# Patient Record
Sex: Female | Born: 1955 | Race: Asian | Hispanic: No | Smoking: Never smoker
Health system: Southern US, Community
[De-identification: ages and names within clinical notes are randomized; demographics above are authoritative.]

## PROBLEM LIST (undated history)

## (undated) DIAGNOSIS — I10 Essential (primary) hypertension: Secondary | ICD-10-CM

---

## 2018-12-10 ENCOUNTER — Other Ambulatory Visit: Payer: Self-pay

## 2018-12-10 ENCOUNTER — Encounter (HOSPITAL_COMMUNITY): Payer: Self-pay

## 2018-12-10 ENCOUNTER — Ambulatory Visit (HOSPITAL_COMMUNITY)
Admission: EM | Admit: 2018-12-10 | Discharge: 2018-12-10 | Disposition: A | Discharge status: Home / Self Care | Payer: Self-pay | Attending: Family Medicine | Admitting: Family Medicine

## 2018-12-10 DIAGNOSIS — I10 Essential (primary) hypertension: Secondary | ICD-10-CM

## 2018-12-10 DIAGNOSIS — Z760 Encounter for issue of repeat prescription: Secondary | ICD-10-CM

## 2018-12-10 HISTORY — DX: Essential (primary) hypertension: I10

## 2018-12-10 MED ORDER — AMLODIPINE BESYLATE 10 MG PO TABS: 10.0000 mg | ORAL_TABLET | Freq: Every day | ORAL | 0 refills | Status: DC

## 2018-12-10 MED ORDER — CARVEDILOL 12.5 MG PO TABS: 12.5000 mg | ORAL_TABLET | Freq: Two times a day (BID) | ORAL | 0 refills | Status: DC

## 2018-12-10 NOTE — ED Triage Notes (Signed)
Patient presents to Urgent Care with complaints of needing a medication refill on her amlodipine and carvedilol since she does not have a PCP.

## 2018-12-10 NOTE — Discharge Instructions (Signed)
Your amlodipine and carvedilol refilled.  Please continue to take as prescribed.  Please establish care with primary care-to contact below  Please go to Emergency Room if you start to experience severe headache, vision changes, decreased urine production, chest pain, shortness of breath, speech slurring, one sided weakness.

## 2018-12-10 NOTE — ED Provider Notes (Signed)
[facility]    CSN: [id number] Arrival date & time: 12/10/18  1128      History   Chief Complaint Chief Complaint  Patient presents with  . Medication Refill    HPI Becky Gross is a 63 y.o. female history of hypertension presenting today for medication refill.  Patient states that she is set to run out of her amlodipine and carvedilol on 6/30.  States that the medicine was initially prescribed to her when she was [place] the [place] from her PCP.  Patient now lives here, but has not set up with PCP as she does not have insurance.  She denies other issues with her heart besides hypertension.  Denies any issues with the medicines.  Denies any symptoms of headache, vision changes, chest pain, shortness of breath.  Denies any lower extremity swelling.  Patient states that she has lost a lot of weight.  She walks on the treadmill for 2 hours a day.  HPI  Past Medical History:  Diagnosis Date  . Hypertension     There are no active problems to display for this patient.   History reviewed. No pertinent surgical history.  OB History   No obstetric history on file.      Home Medications    Prior to Admission medications   Medication Sig Start Date End Date Taking? Authorizing Provider  amLODipine (NORVASC) 10 MG tablet Take 1 tablet (10 mg total) by mouth daily. 12/10/18   Kristelle Cavallaro C, PA-C  carvedilol (COREG) 12.5 MG tablet Take 1 tablet (12.5 mg total) by mouth 2 (two) times daily with a meal. 12/10/18   Kaige Whistler, Junius CreamerHallie C, PA-C    Family History Family History  Family history unknown: Yes    Social History Social History   Tobacco Use  . Smoking status: Never Smoker  . Smokeless tobacco: Never Used  Substance Use Topics  . Alcohol use: Not Currently  . Drug use: Not Currently     Allergies   Patient has no known allergies.   Review of Systems Review of Systems  Constitutional: Negative for fatigue and fever.  HENT: Negative for  congestion, sinus pressure and sore throat.   Eyes: Negative for photophobia, pain and visual disturbance.  Respiratory: Negative for cough and shortness of breath.   Cardiovascular: Negative for chest pain and leg swelling.  Gastrointestinal: Negative for abdominal pain, nausea and vomiting.  Genitourinary: Negative for decreased urine volume and hematuria.  Musculoskeletal: Negative for myalgias, neck pain and neck stiffness.  Neurological: Negative for dizziness, syncope, facial asymmetry, speech difficulty, weakness, light-headedness, numbness and headaches.     Physical Exam Triage Vital Signs ED Triage Vitals  Enc Vitals Group     BP 12/10/18 1154 138/78     Pulse Rate 12/10/18 1154 74     Resp 12/10/18 1154 17     Temp 12/10/18 1154 98.4 F (36.9 C)     Temp Source 12/10/18 1154 Oral     SpO2 12/10/18 1154 100 %     Weight --      Height --      Head Circumference --      Peak Flow --      Pain Score 12/10/18 1151 0     Pain Loc --      Pain Edu? --      Excl. in GC? --    No data found.  Updated Vital Signs BP 138/78 (BP Location: Right Arm)   Pulse 74  Temp 98.4 F (36.9 C) (Oral)   Resp 17   SpO2 100%   Visual Acuity Right Eye Distance:   Left Eye Distance:   Bilateral Distance:    Right Eye Near:   Left Eye Near:    Bilateral Near:     Physical Exam Vitals signs and nursing note reviewed.  Constitutional:      General: She is not in acute distress.    Appearance: She is well-developed.  HENT:     Head: Normocephalic and atraumatic.  Eyes:     Extraocular Movements: Extraocular movements intact.     Conjunctiva/sclera: Conjunctivae normal.     Pupils: Pupils are equal, round, and reactive to light.  Neck:     Musculoskeletal: Neck supple.  Cardiovascular:     Rate and Rhythm: Normal rate and regular rhythm.     Heart sounds: No murmur.  Pulmonary:     Effort: Pulmonary effort is normal. No respiratory distress.     Breath sounds: Normal  breath sounds.     Comments: Breathing comfortably at rest, CTABL, no wheezing, rales or other adventitious sounds auscultated Abdominal:     Palpations: Abdomen is soft.     Tenderness: There is no abdominal tenderness.  Skin:    General: Skin is warm and dry.  Neurological:     Mental Status: She is alert.      UC Treatments / Results  Labs (all labs ordered are listed, but only abnormal results are displayed) Labs Reviewed - No data to display  EKG None  Radiology No results found.  Procedures Procedures (including critical care time)  Medications Ordered in UC Medications - No data to display  Initial Impression / Assessment and Plan / UC Course  I have reviewed the triage vital signs and the nursing notes.  Pertinent labs & imaging results that were available during my care of the patient were reviewed by me and considered in my medical decision making (see chart for details).     Vital signs stable today in clinic.  Will refill medicines to continue to take.  Provided to contact for primary care that accepts patients without insurance.  Advised to follow-up with them for further refills and management of her blood pressure.Discussed strict return precautions. Patient verbalized understanding and is agreeable with plan.  Final Clinical Impressions(s) / UC Diagnoses   Final diagnoses:  Medication refill  Essential hypertension     Discharge Instructions     Your amlodipine and carvedilol refilled.  Please continue to take as prescribed.  Please establish care with primary care-to contact below  Please go to Emergency Room if you start to experience severe headache, vision changes, decreased urine production, chest pain, shortness of breath, speech slurring, one sided weakness.   ED Prescriptions    Medication Sig Dispense Auth. Provider   amLODipine (NORVASC) 10 MG tablet Take 1 tablet (10 mg total) by mouth daily. 60 tablet Anthany Thornhill C, PA-C    carvedilol (COREG) 12.5 MG tablet Take 1 tablet (12.5 mg total) by mouth 2 (two) times daily with a meal. 120 tablet Haizlee Henton C, PA-C     Controlled Substance Prescriptions [place] Controlled Substance Registry consulted? Not Applicable   Janith Lima, [place] 12/10/18 1216

## 2018-12-17 ENCOUNTER — Ambulatory Visit: Payer: Self-pay | Admitting: Family Medicine

## 2019-03-21 ENCOUNTER — Other Ambulatory Visit: Payer: Self-pay

## 2019-03-21 ENCOUNTER — Encounter (HOSPITAL_COMMUNITY): Payer: Self-pay

## 2019-03-21 ENCOUNTER — Telehealth (HOSPITAL_COMMUNITY): Payer: Self-pay | Admitting: Emergency Medicine

## 2019-03-21 ENCOUNTER — Ambulatory Visit (HOSPITAL_COMMUNITY)
Admission: EM | Admit: 2019-03-21 | Discharge: 2019-03-21 | Disposition: A | Discharge status: Home / Self Care | Payer: Self-pay | Attending: Family Medicine | Admitting: Family Medicine

## 2019-03-21 DIAGNOSIS — Z760 Encounter for issue of repeat prescription: Secondary | ICD-10-CM | POA: Insufficient documentation

## 2019-03-21 DIAGNOSIS — I10 Essential (primary) hypertension: Secondary | ICD-10-CM | POA: Insufficient documentation

## 2019-03-21 DIAGNOSIS — Y93B3 Activity, free weights: Secondary | ICD-10-CM

## 2019-03-21 DIAGNOSIS — R238 Other skin changes: Secondary | ICD-10-CM | POA: Insufficient documentation

## 2019-03-21 DIAGNOSIS — S39011A Strain of muscle, fascia and tendon of abdomen, initial encounter: Secondary | ICD-10-CM | POA: Insufficient documentation

## 2019-03-21 LAB — CBC
HCT: 42.7 % (ref 36.0–46.0)
Hemoglobin: 14.3 g/dL (ref 12.0–15.0)
MCH: 30.6 pg (ref 26.0–34.0)
MCHC: 33.5 g/dL (ref 30.0–36.0)
MCV: 91.4 fL (ref 80.0–100.0)
Platelets: 312 10*3/uL (ref 150–400)
RBC: 4.67 MIL/uL (ref 3.87–5.11)
RDW: 11.9 % (ref 11.5–15.5)
WBC: 6.1 10*3/uL (ref 4.0–10.5)
nRBC: 0 % (ref 0.0–0.2)

## 2019-03-21 LAB — BASIC METABOLIC PANEL
Anion gap: 9 (ref 5–15)
BUN: 10 mg/dL (ref 8–23)
CO2: 30 mmol/L (ref 22–32)
Calcium: 9.5 mg/dL (ref 8.9–10.3)
Chloride: 103 mmol/L (ref 98–111)
Creatinine, Ser: 0.64 mg/dL (ref 0.44–1.00)
GFR calc Af Amer: >60 mL/min (ref >60)
GFR calc non Af Amer: >60 mL/min (ref >60)
Glucose, Bld: 130 mg/dL — ABNORMAL HIGH (ref 70–99)
Potassium: 3.7 mmol/L (ref 3.5–5.1)
Sodium: 142 mmol/L (ref 135–145)

## 2019-03-21 MED ORDER — SELENIUM SULFIDE 2.5 % EX LOTN: 1.0000 "application " | TOPICAL_LOTION | Freq: Every day | CUTANEOUS | 2 refills | Status: DC | PRN

## 2019-03-21 MED ORDER — ACETAMINOPHEN 500 MG PO TABS: 1000.0000 mg | ORAL_TABLET | Freq: Three times a day (TID) | ORAL | 0 refills | Status: DC | PRN

## 2019-03-21 MED ORDER — OMEPRAZOLE 20 MG PO CPDR: 20.0000 mg | DELAYED_RELEASE_CAPSULE | Freq: Every day | ORAL | 0 refills | Status: DC

## 2019-03-21 MED ORDER — AMLODIPINE BESYLATE 10 MG PO TABS: 10.0000 mg | ORAL_TABLET | Freq: Every day | ORAL | 0 refills | Status: DC

## 2019-03-21 MED ORDER — CARVEDILOL 12.5 MG PO TABS: 12.5000 mg | ORAL_TABLET | Freq: Two times a day (BID) | ORAL | 0 refills | Status: DC

## 2019-03-21 NOTE — ED Triage Notes (Signed)
Pt presents with epigastric abdominal pain with lifting & movement X 1 week. Pt also complains of scalp issue; pt states she has used many different shampoos and they are not working.  Pt also presents for medication refill.

## 2019-03-21 NOTE — ED Provider Notes (Signed)
[place]    CSN: [id number] Arrival date & time: 03/21/19  0845      History   Chief Complaint Chief Complaint  Patient presents with  . Abdominal Pain  . Medication Refill    HPI Becky Gross is a 63 y.o. female.   Becky Gross presents with complaints of upper abdominal wall pain which started while exercising and lifting weights. 1 week ago. Now, engagement of the affected muscle such as with transitioning from sit to lay and lay to sit as well as twisting, or touching of the area causes pain. Hasn't taken any medications for her pain. Has applied hot compresses. This has not helped. States she has had this occur once before and she was prescribed a medication which took the pain away within a week. No nausea vomiting, shortness of breath , chest pain , fevers, cough. She is eating and drinking regularly. She states she is very active, walks and lifts weights. Also with a dry/flaky patch to posterior scalp, for months. Has tried multiple types of shampoos including head and shoulders, which have not helped. Also requesting refill of her BP medications as she is out. Doesn't follow with a PCP. Received a 60 day supply from this clinic 12/09/28.    ROS per HPI, negative if not otherwise mentioned.      Past Medical History:  Diagnosis Date  . Hypertension     There are no active problems to display for this patient.   History reviewed. No pertinent surgical history.  OB History   No obstetric history on file.      Home Medications    Prior to Admission medications   Medication Sig Start Date End Date Taking? Authorizing Provider  acetaminophen (TYLENOL) 500 MG tablet Take 2 tablets (1,000 mg total) by mouth every 8 (eight) hours as needed for moderate pain. 03/21/19   Zigmund Gottron, NP  amLODipine (NORVASC) 10 MG tablet Take 1 tablet (10 mg total) by mouth daily. 03/21/19   Zigmund Gottron, NP  carvedilol (COREG) 12.5 MG tablet Take 1 tablet  (12.5 mg total) by mouth 2 (two) times daily with a meal. 03/21/19   , Malachy Moan, NP  omeprazole (PRILOSEC) 20 MG capsule Take 1 capsule (20 mg total) by mouth daily. 03/21/19   Zigmund Gottron, NP  selenium sulfide (SELSUN) 2.5 % shampoo Apply 1 application topically daily as needed for irritation. 03/21/19   Zigmund Gottron, NP    Family History Family History  Family history unknown: Yes    Social History Social History   Tobacco Use  . Smoking status: Never Smoker  . Smokeless tobacco: Never Used  Substance Use Topics  . Alcohol use: Not Currently  . Drug use: Not Currently     Allergies   Patient has no known allergies.   Review of Systems Review of Systems   Physical Exam Triage Vital Signs ED Triage Vitals  Enc Vitals Group     BP 03/21/19 0915 (!) 157/73     Pulse Rate 03/21/19 0915 72     Resp 03/21/19 0915 17     Temp 03/21/19 0915 98.5 F (36.9 C)     Temp Source 03/21/19 0915 Oral     SpO2 03/21/19 0915 96 %     Weight --      Height --      Head Circumference --      Peak Flow --      Pain Score 03/21/19  1017 5     Pain Loc --      Pain Edu? --      Excl. in GC? --    No data found.  Updated Vital Signs BP (!) 157/73 (BP Location: Left Arm)   Pulse 72   Temp 98.5 F (36.9 C) (Oral)   Resp 17   SpO2 96%    Physical Exam Constitutional:      General: She is not in acute distress.    Appearance: She is well-developed.  HENT:     Head:      Comments: Dry scaly and flaky patch of skin to skin fold at occiput region of head; no redness, induration or fluctuance; no overlying alopecia; no defined lesion edges visible  Cardiovascular:     Rate and Rhythm: Normal rate and regular rhythm.     Heart sounds: Normal heart sounds.  Pulmonary:     Effort: Pulmonary effort is normal.     Breath sounds: Normal breath sounds.  Abdominal:     Tenderness: There is abdominal tenderness.       Comments: Very specific point tenderness to  epigastric region/ right breast/ upper abdominal wall on palpation; patient appears uncomfortable with engagement of abdominal muscles, demonstrated with transition from sit to lay and then return to sit from lay  Skin:    General: Skin is warm and dry.  Neurological:     Mental Status: She is alert and oriented to person, place, and time.      UC Treatments / Results  Labs (all labs ordered are listed, but only abnormal results are displayed) Labs Reviewed  CBC  BASIC METABOLIC PANEL    EKG   Radiology No results found.  Procedures Procedures (including critical care time)  Medications Ordered in UC Medications - No data to display  Initial Impression / Assessment and Plan / UC Course  I have reviewed the triage vital signs and the nursing notes.  Pertinent labs & imaging results that were available during my care of the patient were reviewed by me and considered in my medical decision making (see chart for details).     Abdominal wall strain from working out, encouraged rest as able until improves, tylenol as needed. No red flag findings with history and exam, point tenderness on palpation. Selsun 2.5% for flaky scalp. Unfortunately affected area is to a skin fold which may be more difficult for resolution. Basic labs obtained as no previous labs in system, meds refilled for 30days, discussed with patient that she needs to establish with a PCP for long term management. Return precautions provided. Patient verbalized understanding and agreeable to plan.  Ambulatory out of clinic without difficulty.    Final Clinical Impressions(s) / UC Diagnoses   Final diagnoses:  Strain of abdominal wall, initial encounter  Dry scalp  Medication refill  Hypertension, unspecified type     Discharge Instructions     May try higher concentration of selsun to help with your scalp.  I have refilled your BP medications for 1 month supply. You need to establish with a primary care  provider for long term management of your medications and for recheck.  Tylenol three times a day to help with your upper abdomin pain, limit lifting and strain to the area until symptoms improve. Daily omeprazole as well.  Any worsening of pain, weakness, shortness of breath , chest pain , nausea, please go to the ER.    ED Prescriptions    Medication Sig  Dispense Auth. Provider   amLODipine (NORVASC) 10 MG tablet Take 1 tablet (10 mg total) by mouth daily. 30 tablet Linus MakoBurky,  B, NP   carvedilol (COREG) 12.5 MG tablet Take 1 tablet (12.5 mg total) by mouth 2 (two) times daily with a meal. 60 tablet Linus MakoBurky,  B, NP   acetaminophen (TYLENOL) 500 MG tablet Take 2 tablets (1,000 mg total) by mouth every 8 (eight) hours as needed for moderate pain. 30 tablet Linus MakoBurky,  B, NP   omeprazole (PRILOSEC) 20 MG capsule Take 1 capsule (20 mg total) by mouth daily. 30 capsule Linus MakoBurky,  B, NP   selenium sulfide (SELSUN) 2.5 % shampoo Apply 1 application topically daily as needed for irritation. 118 mL Linus MakoBurky,  B, NP     PDMP not reviewed this encounter.   Georgetta HaberBurky,  B, NP 03/21/19 1059

## 2019-03-21 NOTE — Telephone Encounter (Signed)
Results reported to patient by phone, given PCP follow up info for recheck of blood work, a1c, blood pressure management.

## 2019-03-21 NOTE — Discharge Instructions (Signed)
May try higher concentration of selsun to help with your scalp.  I have refilled your BP medications for 1 month supply. You need to establish with a primary care provider for long term management of your medications and for recheck.  Tylenol three times a day to help with your upper abdomin pain, limit lifting and strain to the area until symptoms improve. Daily omeprazole as well.  Any worsening of pain, weakness, shortness of breath , chest pain , nausea, please go to the ER.

## 2019-05-19 ENCOUNTER — Ambulatory Visit (HOSPITAL_COMMUNITY)
Admission: EM | Admit: 2019-05-19 | Discharge: 2019-05-19 | Disposition: A | Discharge status: Home / Self Care | Payer: Self-pay | Attending: Family Medicine | Admitting: Family Medicine

## 2019-05-19 ENCOUNTER — Other Ambulatory Visit: Payer: Self-pay

## 2019-05-19 ENCOUNTER — Encounter (HOSPITAL_COMMUNITY): Payer: Self-pay

## 2019-05-19 DIAGNOSIS — I10 Essential (primary) hypertension: Secondary | ICD-10-CM

## 2019-05-19 DIAGNOSIS — L210 Seborrhea capitis: Secondary | ICD-10-CM

## 2019-05-19 MED ORDER — BETAMETHASONE VALERATE 0.1 % EX LOTN: 1.0000 "application " | TOPICAL_LOTION | Freq: Two times a day (BID) | CUTANEOUS | 2 refills | Status: DC

## 2019-05-19 MED ORDER — CARVEDILOL 12.5 MG PO TABS: 12.5000 mg | ORAL_TABLET | Freq: Two times a day (BID) | ORAL | 3 refills | Status: DC

## 2019-05-19 MED ORDER — AMLODIPINE BESYLATE 10 MG PO TABS: 10.0000 mg | ORAL_TABLET | Freq: Every day | ORAL | 3 refills | Status: DC

## 2019-05-19 NOTE — ED Triage Notes (Signed)
Patient presents to Urgent Care with complaints of needing a refill on shampoo she has been using for itching on her scalp. Patient reports she has been using a shampoo that has not had any improvment.

## 2019-05-19 NOTE — ED Provider Notes (Signed)
[place]    CSN: [id number] Arrival date & time: 05/19/19  2774      History   Chief Complaint Chief Complaint  Patient presents with  . Medication Refill    HPI Becky Gross is a 63 y.o. female.   This is a 63 year old established [facility] urgent care patient who comes in for refill on medicated shampoo.  She has been seen here before for hypertension.  Is currently on amlodipine and carvedilol.  Patient has had scaly scalp problem for over a year.  The Selsun does not work.  Has been applying neosporin lately and Johnsons oil.  Not getting better.  Itchy and scaly.     Past Medical History:  Diagnosis Date  . Hypertension     There are no active problems to display for this patient.   History reviewed. No pertinent surgical history.  OB History   No obstetric history on file.      Home Medications    Prior to Admission medications   Medication Sig Start Date End Date Taking? Authorizing Provider  acetaminophen (TYLENOL) 500 MG tablet Take 2 tablets (1,000 mg total) by mouth every 8 (eight) hours as needed for moderate pain. 03/21/19   Zigmund Gottron, NP  amLODipine (NORVASC) 10 MG tablet Take 1 tablet (10 mg total) by mouth daily. 05/19/19   Robyn Haber, MD  betamethasone valerate lotion (VALISONE) 0.1 % Apply 1 application topically 2 (two) times daily. 05/19/19   Robyn Haber, MD  carvedilol (COREG) 12.5 MG tablet Take 1 tablet (12.5 mg total) by mouth 2 (two) times daily with a meal. 05/19/19   Robyn Haber, MD  omeprazole (PRILOSEC) 20 MG capsule Take 1 capsule (20 mg total) by mouth daily. 03/21/19   Zigmund Gottron, NP  selenium sulfide (SELSUN) 2.5 % shampoo Apply 1 application topically daily as needed for irritation. 03/21/19   Zigmund Gottron, NP    Family History Family History  Family history unknown: Yes    Social History Social History   Tobacco Use  . Smoking status: Never Smoker  . Smokeless tobacco:  Never Used  Substance Use Topics  . Alcohol use: Not Currently  . Drug use: Not Currently     Allergies   Patient has no known allergies.   Review of Systems Review of Systems  Skin: Positive for rash.  All other systems reviewed and are negative.    Physical Exam Triage Vital Signs ED Triage Vitals  Enc Vitals Group     BP 05/19/19 0902 131/71     Pulse Rate 05/19/19 0902 67     Resp 05/19/19 0902 15     Temp 05/19/19 0902 98.2 F (36.8 C)     Temp Source 05/19/19 0902 Oral     SpO2 05/19/19 0902 100 %     Weight --      Height --      Head Circumference --      Peak Flow --      Pain Score 05/19/19 0900 0     Pain Loc --      Pain Edu? --      Excl. in [place]? --    No data found.  Updated Vital Signs BP 131/71 (BP Location: Left Arm)   Pulse 67   Temp 98.2 F (36.8 C) (Oral)   Resp 15   SpO2 100%    Physical Exam Vitals signs and nursing note reviewed.  Constitutional:  General: She is not in acute distress.    Appearance: Normal appearance. She is normal weight.  Eyes:     Conjunctiva/sclera: Conjunctivae normal.  Neck:     Musculoskeletal: Normal range of motion and neck supple.  Pulmonary:     Effort: Pulmonary effort is normal.  Musculoskeletal: Normal range of motion.  Skin:    General: Skin is warm.     Findings: Rash present.     Comments: Scaly occipital scalp  Neurological:     General: No focal deficit present.     Mental Status: She is alert and oriented to person, place, and time.  Psychiatric:        Mood and Affect: Mood normal.        Thought Content: Thought content normal.      UC Treatments / Results  Labs (all labs ordered are listed, but only abnormal results are displayed) Labs Reviewed - No data to display  EKG   Radiology No results found.  Procedures Procedures (including critical care time)  Medications Ordered in UC Medications - No data to display  Initial Impression / Assessment and Plan / UC  Course  I have reviewed the triage vital signs and the nursing notes.  Pertinent labs & imaging results that were available during my care of the patient were reviewed by me and considered in my medical decision making (see chart for details).    Final Clinical Impressions(s) / UC Diagnoses   Final diagnoses:  Seborrhea capitis in adult  Essential hypertension   Discharge Instructions   None    ED Prescriptions    Medication Sig Dispense Auth. Provider   amLODipine (NORVASC) 10 MG tablet Take 1 tablet (10 mg total) by mouth daily. 90 tablet Elvina Sidle, MD   carvedilol (COREG) 12.5 MG tablet Take 1 tablet (12.5 mg total) by mouth 2 (two) times daily with a meal. 180 tablet Elvina Sidle, MD   betamethasone valerate lotion (VALISONE) 0.1 % Apply 1 application topically 2 (two) times daily. 60 mL Elvina Sidle, MD     I have reviewed the PDMP during this encounter.   Elvina Sidle, MD 05/19/19 [phone]

## 2019-11-14 ENCOUNTER — Other Ambulatory Visit: Payer: Self-pay

## 2019-11-14 ENCOUNTER — Encounter (HOSPITAL_COMMUNITY): Payer: Self-pay

## 2019-11-14 ENCOUNTER — Ambulatory Visit (HOSPITAL_COMMUNITY)
Admission: EM | Admit: 2019-11-14 | Discharge: 2019-11-14 | Disposition: A | Discharge status: Home / Self Care | Payer: Self-pay | Attending: Family Medicine | Admitting: Family Medicine

## 2019-11-14 DIAGNOSIS — N939 Abnormal uterine and vaginal bleeding, unspecified: Secondary | ICD-10-CM

## 2019-11-14 DIAGNOSIS — S39013A Strain of muscle, fascia and tendon of pelvis, initial encounter: Secondary | ICD-10-CM

## 2019-11-14 NOTE — ED Triage Notes (Signed)
Patient stated she started having vaginal bleeding after having sexual intercourse with her fiance this morning. Noticed passing a small clot this morning. Bleeding very small amounts, has not had to wear a pad.

## 2019-11-14 NOTE — ED Provider Notes (Signed)
[facility]    CSN: [id number] Arrival date & time: 11/14/19  1629      History   Chief Complaint Chief Complaint  Patient presents with  . Vaginal Bleeding    HPI Becky Gross is a 64 y.o. female.   HPI  Patient is here for vaginal bleeding She states that she had a sexual encounter this morning after not having any intercourse for over 20 years She states that it was "very painful" Ever since then she has been bleeding She is here for evaluation Prior to her sexual encounter she felt well She states she had menopause at age 62  Past Medical History:  Diagnosis Date  . Hypertension     There are no problems to display for this patient.   History reviewed. No pertinent surgical history.  OB History   No obstetric history on file.      Home Medications    Prior to Admission medications   Medication Sig Start Date End Date Taking? Authorizing Provider  acetaminophen (TYLENOL) 500 MG tablet Take 2 tablets (1,000 mg total) by mouth every 8 (eight) hours as needed for moderate pain. 03/21/19   Georgetta Haber, NP  amLODipine (NORVASC) 10 MG tablet Take 1 tablet (10 mg total) by mouth daily. 05/19/19   Elvina Sidle, MD  betamethasone valerate lotion (VALISONE) 0.1 % Apply 1 application topically 2 (two) times daily. 05/19/19   Elvina Sidle, MD  carvedilol (COREG) 12.5 MG tablet Take 1 tablet (12.5 mg total) by mouth 2 (two) times daily with a meal. 05/19/19   Elvina Sidle, MD  omeprazole (PRILOSEC) 20 MG capsule Take 1 capsule (20 mg total) by mouth daily. 03/21/19   Georgetta Haber, NP  selenium sulfide (SELSUN) 2.5 % shampoo Apply 1 application topically daily as needed for irritation. 03/21/19   Georgetta Haber, NP    Family History Family History  Family history unknown: Yes    Social History Social History   Tobacco Use  . Smoking status: Never Smoker  . Smokeless tobacco: Never Used  Substance Use Topics  . Alcohol use:  Not Currently  . Drug use: Not Currently     Allergies   Patient has no known allergies.   Review of Systems Review of Systems  Genitourinary: Positive for vaginal bleeding.     Physical Exam Triage Vital Signs ED Triage Vitals  Enc Vitals Group     BP 11/14/19 1726 (!) 176/78     Pulse Rate 11/14/19 1726 63     Resp 11/14/19 1726 14     Temp 11/14/19 1726 99 F (37.2 C)     Temp Source 11/14/19 1726 Oral     SpO2 11/14/19 1726 97 %     Weight --      Height --      Head Circumference --      Peak Flow --      Pain Score 11/14/19 1724 4     Pain Loc --      Pain Edu? --      Excl. in GC? --    No data found.  Updated Vital Signs BP (!) 176/78 (BP Location: Left Arm)   Pulse 63   Temp 99 F (37.2 C) (Oral)   Resp 14   SpO2 97%       Physical Exam Constitutional:      General: She is not in acute distress.    Appearance: She is well-developed.  HENT:  Head: Normocephalic and atraumatic.  Eyes:     Conjunctiva/sclera: Conjunctivae normal.     Pupils: Pupils are equal, round, and reactive to light.  Cardiovascular:     Rate and Rhythm: Normal rate.  Pulmonary:     Effort: Pulmonary effort is normal. No respiratory distress.  Abdominal:     General: There is no distension.     Palpations: Abdomen is soft.     Tenderness: There is no abdominal tenderness.  Genitourinary:    Exam position: Lithotomy position.     Labia:        Right: No rash, tenderness or injury.        Left: No rash, tenderness or injury.      Vagina: Signs of injury present.        Comments: There was blood in the vault.  Small clots.  Cervix is parous.  No blood in the os.  Laceration is inferior to the cervix.  It is not through and through.  It is bleeding slightly.  Bimanual is negative, no uterine tenderness or abdominal tenderness Musculoskeletal:        General: Normal range of motion.     Cervical back: Normal range of motion.  Skin:    General: Skin is warm and  dry.  Neurological:     Mental Status: She is alert.      UC Treatments / Results  Labs (all labs ordered are listed, but only abnormal results are displayed) Labs Reviewed - No data to display  EKG   Radiology No results found.  Procedures Procedures (including critical care time)  Medications Ordered in UC Medications - No data to display  Initial Impression / Assessment and Plan / UC Course  I have reviewed the triage vital signs and the nursing notes.  Pertinent labs & imaging results that were available during my care of the patient were reviewed by me and considered in my medical decision making (see chart for details).      Final Clinical Impressions(s) / UC Diagnoses   Final diagnoses:  Tear of vaginal muscle, initial encounter  Vaginal bleeding     Discharge Instructions     This should heal without treatment Expect bleeding another day or so It should gradually go away See a gynecologist if it is not better by Monday Go to ER if it gets worse   ED Prescriptions    None     PDMP not reviewed this encounter.   Raylene Everts, MD 11/14/19 2158

## 2019-11-14 NOTE — Discharge Instructions (Signed)
This should heal without treatment Expect bleeding another day or so It should gradually go away See a gynecologist if it is not better by Monday Go to ER if it gets worse

## 2019-12-30 ENCOUNTER — Ambulatory Visit (HOSPITAL_COMMUNITY)
Admission: EM | Admit: 2019-12-30 | Discharge: 2019-12-30 | Disposition: A | Discharge status: Home / Self Care | Payer: BC Managed Care – PPO | Attending: Emergency Medicine | Admitting: Emergency Medicine

## 2019-12-30 DIAGNOSIS — I10 Essential (primary) hypertension: Secondary | ICD-10-CM | POA: Insufficient documentation

## 2019-12-30 DIAGNOSIS — N898 Other specified noninflammatory disorders of vagina: Secondary | ICD-10-CM

## 2019-12-30 DIAGNOSIS — Z79899 Other long term (current) drug therapy: Secondary | ICD-10-CM | POA: Insufficient documentation

## 2019-12-30 NOTE — ED Triage Notes (Signed)
Pt c/o vaginal discharge red in color for approx 1 week; light in flow. Denies odor to discharge, abdominal pain, n/v/d, fever, chills, vaginal rash or dysuria symptoms.  Wants a referral to PMD.

## 2019-12-30 NOTE — ED Provider Notes (Signed)
[facility]    CSN: [id number] Arrival date & time: 12/30/19  0805      History   Chief Complaint Chief Complaint  Patient presents with  . Vaginal Discharge    HPI Becky Gross is a 64 y.o. female history of hypertension presenting today for evaluation of vaginal discharge.  Patient reports that over the past 1-2 weeks she has had vaginal discharge.  She denies any associated itching, irritation or urinary symptoms.  Denies abdominal pain nausea or vomiting.  She does report brown, occasional reddish discoloration.  Previously she was having bleeding which she was seen here for over a month ago and appeared to have a vaginal tear to the inferior portion of the cervix.  She had intercourse for the first time in 20 years approximately 1 month ago.  She is concerned as she has not had vaginal discharge and over 20 years.  Became menopausal around age 35.  Reports ultrasound of uterus in 2019 which was normal.  HPI  Past Medical History:  Diagnosis Date  . Hypertension     There are no problems to display for this patient.   No past surgical history on file.  OB History   No obstetric history on file.      Home Medications    Prior to Admission medications   Medication Sig Start Date End Date Taking? Authorizing Provider  amLODipine (NORVASC) 10 MG tablet Take 1 tablet (10 mg total) by mouth daily. 05/19/19  Yes Elvina Sidle, MD  carvedilol (COREG) 12.5 MG tablet Take 1 tablet (12.5 mg total) by mouth 2 (two) times daily with a meal. 05/19/19  Yes Elvina Sidle, MD  omeprazole (PRILOSEC) 20 MG capsule Take 1 capsule (20 mg total) by mouth daily. 03/21/19  Yes Linus Mako B, NP  acetaminophen (TYLENOL) 500 MG tablet Take 2 tablets (1,000 mg total) by mouth every 8 (eight) hours as needed for moderate pain. 03/21/19   Georgetta Haber, NP  betamethasone valerate lotion (VALISONE) 0.1 % Apply 1 application topically 2 (two) times daily. 05/19/19    Elvina Sidle, MD  selenium sulfide (SELSUN) 2.5 % shampoo Apply 1 application topically daily as needed for irritation. 03/21/19   Georgetta Haber, NP    Family History Family History  Family history unknown: Yes    Social History Social History   Tobacco Use  . Smoking status: Never Smoker  . Smokeless tobacco: Never Used  Vaping Use  . Vaping Use: Never used  Substance Use Topics  . Alcohol use: Not Currently  . Drug use: Not Currently     Allergies   Patient has no known allergies.   Review of Systems Review of Systems  Constitutional: Negative for fever.  Respiratory: Negative for shortness of breath.   Cardiovascular: Negative for chest pain.  Gastrointestinal: Negative for abdominal pain, diarrhea, nausea and vomiting.  Genitourinary: Positive for vaginal discharge. Negative for dysuria, flank pain, genital sores, hematuria, menstrual problem, vaginal bleeding and vaginal pain.  Musculoskeletal: Negative for back pain.  Skin: Negative for rash.  Neurological: Negative for dizziness, light-headedness and headaches.     Physical Exam Triage Vital Signs ED Triage Vitals  Enc Vitals Group     BP 12/30/19 0818 (!) 164/85     Pulse Rate 12/30/19 0818 66     Resp 12/30/19 0818 18     Temp 12/30/19 0818 98.2 F (36.8 C)     Temp Source 12/30/19 0818 Oral     SpO2  12/30/19 0818 98 %     Weight --      Height --      Head Circumference --      Peak Flow --      Pain Score 12/30/19 0817 0     Pain Loc --      Pain Edu? --      Excl. in GC? --    No data found.  Updated Vital Signs BP (!) 164/85 (BP Location: Right Arm)   Pulse 66   Temp 98.2 F (36.8 C) (Oral)   Resp 18   SpO2 98%   Visual Acuity Right Eye Distance:   Left Eye Distance:   Bilateral Distance:    Right Eye Near:   Left Eye Near:    Bilateral Near:     Physical Exam Vitals and nursing note reviewed.  Constitutional:      Appearance: She is well-developed.     Comments: No  acute distress  HENT:     Head: Normocephalic and atraumatic.     Nose: Nose normal.  Eyes:     Conjunctiva/sclera: Conjunctivae normal.  Cardiovascular:     Rate and Rhythm: Normal rate.  Pulmonary:     Effort: Pulmonary effort is normal. No respiratory distress.  Abdominal:     General: There is no distension.  Genitourinary:    Comments: Normal external female genitalia, moderate amount of yellowish-brownish discharge present in vagina, cervix with small area of erythema/bleeding noted to 11 o'clock position of cervix Musculoskeletal:        General: Normal range of motion.     Cervical back: Neck supple.  Skin:    General: Skin is warm and dry.  Neurological:     Mental Status: She is alert and oriented to person, place, and time.      UC Treatments / Results  Labs (all labs ordered are listed, but only abnormal results are displayed) Labs Reviewed  CERVICOVAGINAL ANCILLARY ONLY    EKG   Radiology No results found.  Procedures Procedures (including critical care time)  Medications Ordered in UC Medications - No data to display  Initial Impression / Assessment and Plan / UC Course  I have reviewed the triage vital signs and the nursing notes.  Pertinent labs & imaging results that were available during my care of the patient were reviewed by me and considered in my medical decision making (see chart for details).     Screening vaginal discharge for gonorrhea, chlamydia, trichomonas yeast and BV.  Will call with results and provide treatment based off results.  Deferring empiric therapy today.  Given bleeding and lesion on cervix as well as in combination with patient age, recommend follow-up with OB/GYN for further evaluation especially if symptoms unremarkable.  Discussed strict return precautions. Patient verbalized understanding and is agreeable with plan.  Final Clinical Impressions(s) / UC Diagnoses   Final diagnoses:  Vaginal discharge      Discharge Instructions     Vaginal swab pending- this will check for gonorrhea, chlamydia, trichomonas, yeast and bacterial vaginosis We will call with results in 2-3 days I recommend following up with OBGYN for further evaluation of lesion on cervix that is bleeding    ED Prescriptions    None     PDMP not reviewed this encounter.   Lew Dawes, [place] 12/30/19 [phone]

## 2019-12-30 NOTE — Discharge Instructions (Addendum)
Vaginal swab pending- this will check for gonorrhea, chlamydia, trichomonas, yeast and bacterial vaginosis We will call with results in 2-3 days I recommend following up with OBGYN for further evaluation of lesion on cervix that is bleeding

## 2019-12-31 LAB — CERVICOVAGINAL ANCILLARY ONLY
Bacterial Vaginitis (gardnerella): NEGATIVE
Candida Glabrata: NEGATIVE
Candida Vaginitis: NEGATIVE
Chlamydia: NEGATIVE
Comment: NEGATIVE
Comment: NEGATIVE
Comment: NEGATIVE
Comment: NEGATIVE
Comment: NEGATIVE
Comment: NORMAL
Neisseria Gonorrhea: NEGATIVE
Trichomonas: POSITIVE — AB

## 2020-01-02 ENCOUNTER — Telehealth (HOSPITAL_COMMUNITY): Payer: Self-pay

## 2020-01-02 MED ORDER — METRONIDAZOLE 500 MG PO TABS: 500.0000 mg | ORAL_TABLET | Freq: Two times a day (BID) | ORAL | 0 refills | Status: DC

## 2020-01-14 ENCOUNTER — Ambulatory Visit (HOSPITAL_COMMUNITY)
Admission: EM | Admit: 2020-01-14 | Discharge: 2020-01-14 | Disposition: A | Discharge status: Home / Self Care | Payer: BC Managed Care – PPO

## 2020-01-14 ENCOUNTER — Encounter (HOSPITAL_COMMUNITY): Payer: Self-pay | Admitting: Emergency Medicine

## 2020-01-14 ENCOUNTER — Other Ambulatory Visit: Payer: Self-pay

## 2020-01-14 DIAGNOSIS — A599 Trichomoniasis, unspecified: Secondary | ICD-10-CM

## 2020-01-14 NOTE — ED Provider Notes (Signed)
[facility]   MRN: [id number] DOB: 12-23-1955  Subjective:   Becky Gross is a 64 y.o. female presenting for recheck of her trichomoniasis infection. Patient was last seen on 12/30/2019, had cervical swab done and treated with metronidazole. Patient took it for 5 days and then had to stop due to some chest discomfort it was given her. As soon as she stopped the medication the symptoms also stopped. She consider them side effects and has not had recurrence. She has since improved in terms of her vaginal discharge, malodor from the vaginal area. She has significant concerns about her bill, states that she does not believe she should have to pay a bill since she has Medicare. Would also like to have a retest.  No current facility-administered medications for this encounter.  Current Outpatient Medications:  .  amLODipine (NORVASC) 10 MG tablet, Take 1 tablet (10 mg total) by mouth daily., Disp: 90 tablet, Rfl: 3 .  carvedilol (COREG) 12.5 MG tablet, Take 1 tablet (12.5 mg total) by mouth 2 (two) times daily with a meal., Disp: 180 tablet, Rfl: 3 .  acetaminophen (TYLENOL) 500 MG tablet, Take 2 tablets (1,000 mg total) by mouth every 8 (eight) hours as needed for moderate pain., Disp: 30 tablet, Rfl: 0 .  betamethasone valerate lotion (VALISONE) 0.1 %, Apply 1 application topically 2 (two) times daily., Disp: 60 mL, Rfl: 2 .  metroNIDAZOLE (FLAGYL) 500 MG tablet, Take 1 tablet (500 mg total) by mouth 2 (two) times daily., Disp: 14 tablet, Rfl: 0 .  omeprazole (PRILOSEC) 20 MG capsule, Take 1 capsule (20 mg total) by mouth daily., Disp: 30 capsule, Rfl: 0 .  selenium sulfide (SELSUN) 2.5 % shampoo, Apply 1 application topically daily as needed for irritation., Disp: 118 mL, Rfl: 2   No Known Allergies  Past Medical History:  Diagnosis Date  . Hypertension      History reviewed. No pertinent surgical history.  Family History  Family history unknown: Yes    Social History    Tobacco Use  . Smoking status: Never Smoker  . Smokeless tobacco: Never Used  Vaping Use  . Vaping Use: Never used  Substance Use Topics  . Alcohol use: Not Currently  . Drug use: Not Currently    ROS   Objective:   Vitals: BP 127/75 (BP Location: Right Arm)   Pulse 66   Temp 98.8 F (37.1 C) (Oral)   Resp 18   SpO2 97%   Physical Exam Constitutional:      General: She is not in acute distress.    Appearance: Normal appearance. She is well-developed. She is not ill-appearing.  HENT:     Head: Normocephalic and atraumatic.     Nose: Nose normal.     Mouth/Throat:     Mouth: Mucous membranes are moist.     Pharynx: Oropharynx is clear.  Eyes:     General: No scleral icterus.    Extraocular Movements: Extraocular movements intact.     Pupils: Pupils are equal, round, and reactive to light.  Cardiovascular:     Rate and Rhythm: Normal rate.  Pulmonary:     Effort: Pulmonary effort is normal.  Skin:    General: Skin is warm and dry.  Neurological:     General: No focal deficit present.     Mental Status: She is alert and oriented to person, place, and time.  Psychiatric:        Mood and Affect: Mood normal.  Behavior: Behavior normal.      Assessment and Plan :   PDMP not reviewed this encounter.  1. Trichomoniasis     Counseled patient regarding rechecking, retesting and billing. Patient does not want to pay for any more medical bills from our facility. Encouraged patient to discuss this with her patient asked staff to be given the information to billing department. Advised her that if she has no symptoms anymore, retest is not required and in order to avoid more billing will defer testing. Patient was in agreement.   Wallis Bamberg, [place] 01/14/20 [phone]

## 2020-01-14 NOTE — Discharge Instructions (Signed)
We are not retesting you today and therefore will not do a charge for today's visit.

## 2020-01-14 NOTE — ED Triage Notes (Signed)
Patient is requesting instruction on labs from prior visit.  Patient denies complaints at this time

## 2020-02-25 ENCOUNTER — Other Ambulatory Visit: Payer: Self-pay

## 2020-02-25 ENCOUNTER — Encounter (HOSPITAL_COMMUNITY): Payer: Self-pay | Admitting: Emergency Medicine

## 2020-02-25 ENCOUNTER — Ambulatory Visit (HOSPITAL_COMMUNITY)
Admission: EM | Admit: 2020-02-25 | Discharge: 2020-02-25 | Disposition: A | Discharge status: Home / Self Care | Payer: BC Managed Care – PPO | Attending: Urgent Care | Admitting: Urgent Care

## 2020-02-25 DIAGNOSIS — R319 Hematuria, unspecified: Secondary | ICD-10-CM | POA: Diagnosis not present

## 2020-02-25 DIAGNOSIS — R300 Dysuria: Secondary | ICD-10-CM | POA: Diagnosis present

## 2020-02-25 DIAGNOSIS — R102 Pelvic and perineal pain: Secondary | ICD-10-CM | POA: Insufficient documentation

## 2020-02-25 DIAGNOSIS — Z8619 Personal history of other infectious and parasitic diseases: Secondary | ICD-10-CM | POA: Diagnosis not present

## 2020-02-25 DIAGNOSIS — I10 Essential (primary) hypertension: Secondary | ICD-10-CM | POA: Insufficient documentation

## 2020-02-25 DIAGNOSIS — Z79899 Other long term (current) drug therapy: Secondary | ICD-10-CM | POA: Insufficient documentation

## 2020-02-25 LAB — POCT URINALYSIS DIPSTICK, ED / UC
Bilirubin Urine: NEGATIVE
Glucose, UA: NEGATIVE mg/dL
Hgb urine dipstick: NEGATIVE
Ketones, ur: NEGATIVE mg/dL
Nitrite: NEGATIVE
Protein, ur: NEGATIVE mg/dL
Specific Gravity, Urine: 1.025 (ref 1.005–1.030)
Urobilinogen, UA: 0.2 mg/dL (ref 0.0–1.0)
pH: 6.5 (ref 5.0–8.0)

## 2020-02-25 MED ORDER — CEPHALEXIN 250 MG PO CAPS: 250.0000 mg | ORAL_CAPSULE | Freq: Two times a day (BID) | ORAL | 0 refills | Status: DC

## 2020-02-25 NOTE — ED Triage Notes (Signed)
Pt presents with abdominal pain and blood in urine. States this happened yesterday and after 1 hour it stopped.

## 2020-02-25 NOTE — Discharge Instructions (Signed)
Make sure you hydrate very well with plain water and a quantity of 64 ounces of water a day.  Please limit drinks that are considered urinary irritants such as soda, sweet tea, coffee, energy drinks, alcohol.  These can worsen your UTI symptoms and also be the source of them.  I will let you know about your urine culture results through MyChart to see if we need to change your antibiotics based off of those results. ° °

## 2020-02-25 NOTE — ED Provider Notes (Addendum)
[facility]   MRN: [id number] DOB: 1956/03/12  Subjective:   Becky Gross is a 65 y.o. female presenting for 1 day history of acute onset dysuria, urinary frequency, lower abdominal/pelvic pain, also noticed hematuria this morning.  Patient denies fever, nausea, vomiting, vaginal discharge.  She does have a history of trichomonas.  States that the symptoms are different.  Patient would like to make sure she does not have a sexually transmitted infection however.  No current facility-administered medications for this encounter.  Current Outpatient Medications:  .  acetaminophen (TYLENOL) 500 MG tablet, Take 2 tablets (1,000 mg total) by mouth every 8 (eight) hours as needed for moderate pain., Disp: 30 tablet, Rfl: 0 .  amLODipine (NORVASC) 10 MG tablet, Take 1 tablet (10 mg total) by mouth daily., Disp: 90 tablet, Rfl: 3 .  betamethasone valerate lotion (VALISONE) 0.1 %, Apply 1 application topically 2 (two) times daily., Disp: 60 mL, Rfl: 2 .  carvedilol (COREG) 12.5 MG tablet, Take 1 tablet (12.5 mg total) by mouth 2 (two) times daily with a meal., Disp: 180 tablet, Rfl: 3 .  omeprazole (PRILOSEC) 20 MG capsule, Take 1 capsule (20 mg total) by mouth daily., Disp: 30 capsule, Rfl: 0 .  selenium sulfide (SELSUN) 2.5 % shampoo, Apply 1 application topically daily as needed for irritation., Disp: 118 mL, Rfl: 2   No Known Allergies  Past Medical History:  Diagnosis Date  . Hypertension      History reviewed. No pertinent surgical history.  Family History  Family history unknown: Yes    Social History   Tobacco Use  . Smoking status: Never Smoker  . Smokeless tobacco: Never Used  Vaping Use  . Vaping Use: Never used  Substance Use Topics  . Alcohol use: Not Currently  . Drug use: Not Currently    ROS   Objective:   Vitals: BP (!) 134/91 (BP Location: Right Arm)   Pulse 67   Temp 98.3 F (36.8 C) (Oral)   Resp 18   SpO2 96%   Physical  Exam Constitutional:      General: She is not in acute distress.    Appearance: Normal appearance. She is well-developed. She is not ill-appearing, toxic-appearing or diaphoretic.  HENT:     Head: Normocephalic and atraumatic.     Nose: Nose normal.     Mouth/Throat:     Mouth: Mucous membranes are moist.     Pharynx: Oropharynx is clear.  Eyes:     General: No scleral icterus.       Right eye: No discharge.        Left eye: No discharge.     Extraocular Movements: Extraocular movements intact.     Conjunctiva/sclera: Conjunctivae normal.     Pupils: Pupils are equal, round, and reactive to light.  Cardiovascular:     Rate and Rhythm: Normal rate.  Pulmonary:     Effort: Pulmonary effort is normal.  Abdominal:     General: Bowel sounds are normal. There is no distension.     Palpations: Abdomen is soft. There is no mass.     Tenderness: There is no abdominal tenderness. There is no right CVA tenderness, left CVA tenderness, guarding or rebound.  Skin:    General: Skin is warm and dry.  Neurological:     General: No focal deficit present.     Mental Status: She is alert and oriented to person, place, and time.  Psychiatric:  Mood and Affect: Mood normal.        Behavior: Behavior normal.        Thought Content: Thought content normal.        Judgment: Judgment normal.     Results for orders placed or performed during the hospital encounter of 02/25/20 (from the past 24 hour(s))  POC Urinalysis dipstick     Status: Abnormal   Collection Time: 02/25/20  9:28 AM  Result Value Ref Range   Glucose, UA NEGATIVE NEGATIVE mg/dL   Bilirubin Urine NEGATIVE NEGATIVE   Ketones, ur NEGATIVE NEGATIVE mg/dL   Specific Gravity, Urine 1.025 1.005 - 1.030   Hgb urine dipstick NEGATIVE NEGATIVE   pH 6.5 5.0 - 8.0   Protein, ur NEGATIVE NEGATIVE mg/dL   Urobilinogen, UA 0.2 0.0 - 1.0 mg/dL   Nitrite NEGATIVE NEGATIVE   Leukocytes,Ua TRACE (A) NEGATIVE    Assessment and Plan :    PDMP not reviewed this encounter.  1. Dysuria   2. Hematuria, unspecified type   3. Pelvic pain in female   4. History of trichomoniasis     Start Keflex 250mg  BID x5 days to cover for acute cystitis, urine culture pending.  STI testing pending.  Recommended aggressive hydration, limiting urinary irritants. Counseled patient on potential for adverse effects with medications prescribed/recommended today, ER and return-to-clinic precautions discussed, patient verbalized understanding.    , Wallis Bamberg 02/25/20 [phone]

## 2020-02-26 LAB — CERVICOVAGINAL ANCILLARY ONLY
Bacterial Vaginitis (gardnerella): NEGATIVE
Candida Glabrata: NEGATIVE
Candida Vaginitis: NEGATIVE
Chlamydia: NEGATIVE
Comment: NEGATIVE
Comment: NEGATIVE
Comment: NEGATIVE
Comment: NEGATIVE
Comment: NEGATIVE
Comment: NORMAL
Neisseria Gonorrhea: NEGATIVE
Trichomonas: NEGATIVE

## 2020-02-27 ENCOUNTER — Telehealth (HOSPITAL_COMMUNITY): Payer: Self-pay | Admitting: Emergency Medicine

## 2020-02-27 LAB — URINE CULTURE: Culture: >=100000 — AB

## 2020-02-27 MED ORDER — NITROFURANTOIN MONOHYD MACRO 100 MG PO CAPS: 100.0000 mg | ORAL_CAPSULE | Freq: Two times a day (BID) | ORAL | 0 refills | Status: DC

## 2020-03-31 ENCOUNTER — Other Ambulatory Visit: Payer: Self-pay

## 2020-03-31 ENCOUNTER — Encounter (HOSPITAL_COMMUNITY): Payer: Self-pay | Admitting: Emergency Medicine

## 2020-03-31 ENCOUNTER — Ambulatory Visit (HOSPITAL_COMMUNITY)
Admission: EM | Admit: 2020-03-31 | Discharge: 2020-03-31 | Disposition: A | Discharge status: Home / Self Care | Payer: BC Managed Care – PPO | Attending: Family Medicine | Admitting: Family Medicine

## 2020-03-31 DIAGNOSIS — R21 Rash and other nonspecific skin eruption: Secondary | ICD-10-CM | POA: Diagnosis not present

## 2020-03-31 MED ORDER — TRIAMCINOLONE ACETONIDE 0.025 % EX OINT: 1.0000 "application " | TOPICAL_OINTMENT | Freq: Two times a day (BID) | CUTANEOUS | 0 refills | Status: DC | PRN

## 2020-03-31 NOTE — ED Triage Notes (Signed)
Pt c/o facial rash between her eyes x 1 month. She states the rash is progressively getting worse. She states it is itchy and her skin is dry.

## 2020-04-01 NOTE — ED Provider Notes (Signed)
[facility]    CSN: [id number] Arrival date & time: 03/31/20  4709      History   Chief Complaint Chief Complaint  Patient presents with  . Rash    HPI Becky Gross is a 64 y.o. female.   Here today with concerns of a rash between her eyes that's been present for about a month. Rash is red, flaky and very itchy. Denies pain, new makeup or hygiene products, fever, chills, joint pains, hx of autoimmune conditions or similar rashes. Has not tried anything OTC for this. Only other issue is having some dry eye sxs that are also ongoing for which she uses OTC eye drops.      Past Medical History:  Diagnosis Date  . Hypertension     There are no problems to display for this patient.   History reviewed. No pertinent surgical history.  OB History   No obstetric history on file.      Home Medications    Prior to Admission medications   Medication Sig Start Date End Date Taking? Authorizing Provider  acetaminophen (TYLENOL) 500 MG tablet Take 2 tablets (1,000 mg total) by mouth every 8 (eight) hours as needed for moderate pain. 03/21/19   Georgetta Haber, NP  amLODipine (NORVASC) 10 MG tablet Take 1 tablet (10 mg total) by mouth daily. 05/19/19   Elvina Sidle, MD  betamethasone valerate lotion (VALISONE) 0.1 % Apply 1 application topically 2 (two) times daily. 05/19/19   Elvina Sidle, MD  carvedilol (COREG) 12.5 MG tablet Take 1 tablet (12.5 mg total) by mouth 2 (two) times daily with a meal. 05/19/19   Elvina Sidle, MD  cephALEXin (KEFLEX) 250 MG capsule Take 1 capsule (250 mg total) by mouth 2 (two) times daily. 02/25/20   Wallis Bamberg, PA-C  nitrofurantoin, macrocrystal-monohydrate, (MACROBID) 100 MG capsule Take 1 capsule (100 mg total) by mouth 2 (two) times daily. 02/27/20   Merrilee Jansky, MD  omeprazole (PRILOSEC) 20 MG capsule Take 1 capsule (20 mg total) by mouth daily. 03/21/19   Georgetta Haber, NP  selenium sulfide (SELSUN) 2.5 % shampoo  Apply 1 application topically daily as needed for irritation. 03/21/19   Linus Mako B, NP  triamcinolone (KENALOG) 0.025 % ointment Apply 1 application topically 2 (two) times daily as needed. 03/31/20   Particia Nearing, PA-C    Family History Family History  Family history unknown: Yes    Social History Social History   Tobacco Use  . Smoking status: Never Smoker  . Smokeless tobacco: Never Used  Vaping Use  . Vaping Use: Never used  Substance Use Topics  . Alcohol use: Not Currently  . Drug use: Not Currently     Allergies   Patient has no known allergies.   Review of Systems Review of Systems PER HPI    Physical Exam Triage Vital Signs ED Triage Vitals  Enc Vitals Group     BP 03/31/20 0837 124/73     Pulse Rate 03/31/20 0837 (!) 59     Resp 03/31/20 0837 17     Temp 03/31/20 0837 98.5 F (36.9 C)     Temp Source 03/31/20 0837 Oral     SpO2 03/31/20 0837 100 %     Weight --      Height --      Head Circumference --      Peak Flow --      Pain Score 03/31/20 0835 0     Pain  Loc --      Pain Edu? --      Excl. in GC? --    No data found.  Updated Vital Signs BP 124/73 (BP Location: Left Arm)   Pulse (!) 59   Temp 98.5 F (36.9 C) (Oral)   Resp 17   SpO2 100%   Visual Acuity Right Eye Distance:   Left Eye Distance:   Bilateral Distance:    Right Eye Near:   Left Eye Near:    Bilateral Near:     Physical Exam Vitals and nursing note reviewed.  Constitutional:      Appearance: Normal appearance. She is not ill-appearing.  HENT:     Head: Atraumatic.  Eyes:     General:        Right eye: No discharge.        Left eye: No discharge.     Extraocular Movements: Extraocular movements intact.     Conjunctiva/sclera: Conjunctivae normal.     Pupils: Pupils are equal, round, and reactive to light.  Cardiovascular:     Rate and Rhythm: Normal rate and regular rhythm.     Heart sounds: Normal heart sounds.  Pulmonary:     Effort:  Pulmonary effort is normal.     Breath sounds: Normal breath sounds.  Musculoskeletal:        General: Normal range of motion.     Cervical back: Normal range of motion and neck supple.  Skin:    General: Skin is warm and dry.     Findings: Rash (erythematous macular oval shaped area with flaking) present.  Neurological:     Mental Status: She is alert and oriented to person, place, and time.  Psychiatric:        Mood and Affect: Mood normal.        Thought Content: Thought content normal.        Judgment: Judgment normal.    UC Treatments / Results  Labs (all labs ordered are listed, but only abnormal results are displayed) Labs Reviewed - No data to display  EKG   Radiology No results found.  Procedures Procedures (including critical care time)  Medications Ordered in UC Medications - No data to display  Initial Impression / Assessment and Plan / UC Course  I have reviewed the triage vital signs and the nursing notes.  Pertinent labs & imaging results that were available during my care of the patient were reviewed by me and considered in my medical decision making (see chart for details).     Suspect inflammatory cause, trial triamcinolone mixed with moisturizer to area twice daily. F/u with PCP or Dermatologist if not fully resolving.  Final Clinical Impressions(s) / UC Diagnoses   Final diagnoses:  Rash   Discharge Instructions   None    ED Prescriptions    Medication Sig Dispense Auth. Provider   triamcinolone (KENALOG) 0.025 % ointment Apply 1 application topically 2 (two) times daily as needed. 30 g Particia Nearing, [place]     PDMP not reviewed this encounter.   Particia Nearing, [place] 04/01/20 1722

## 2020-04-21 ENCOUNTER — Encounter (HOSPITAL_COMMUNITY): Payer: Self-pay

## 2020-04-21 ENCOUNTER — Ambulatory Visit (HOSPITAL_COMMUNITY)
Admission: EM | Admit: 2020-04-21 | Discharge: 2020-04-21 | Disposition: A | Discharge status: Home / Self Care | Payer: BC Managed Care – PPO | Attending: Family Medicine | Admitting: Family Medicine

## 2020-04-21 ENCOUNTER — Other Ambulatory Visit: Payer: Self-pay

## 2020-04-21 DIAGNOSIS — Z760 Encounter for issue of repeat prescription: Secondary | ICD-10-CM

## 2020-04-21 MED ORDER — CARVEDILOL 12.5 MG PO TABS: 12.5000 mg | ORAL_TABLET | Freq: Two times a day (BID) | ORAL | 1 refills | Status: DC

## 2020-04-21 MED ORDER — AMLODIPINE BESYLATE 10 MG PO TABS: 10.0000 mg | ORAL_TABLET | Freq: Every day | ORAL | 1 refills | Status: DC

## 2020-04-21 NOTE — Discharge Instructions (Signed)
I have refilled your blood pressure medicine I have given you contact for primary care for follow up Please call them today.

## 2020-04-21 NOTE — ED Triage Notes (Signed)
Pt presents for medication refill Carvedilol 12.5 mg and amlodipine 10 mg. States she needs a PCP.

## 2020-04-21 NOTE — ED Provider Notes (Signed)
[facility]    CSN: [id number] Arrival date & time: 04/21/20  0841      History   Chief Complaint Chief Complaint  Patient presents with  . Medication Refill    HPI Becky Gross is a 64 y.o. female.   Pt is a 64 year old female that presents for medication refill. She recently ran out. She does not have a PCP. No concerning signs or symptoms.      Past Medical History:  Diagnosis Date  . Hypertension     There are no problems to display for this patient.   History reviewed. No pertinent surgical history.  OB History   No obstetric history on file.      Home Medications    Prior to Admission medications   Medication Sig Start Date End Date Taking? Authorizing Provider  acetaminophen (TYLENOL) 500 MG tablet Take 2 tablets (1,000 mg total) by mouth every 8 (eight) hours as needed for moderate pain. 03/21/19   Georgetta Haber, NP  amLODipine (NORVASC) 10 MG tablet Take 1 tablet (10 mg total) by mouth daily. 04/21/20   Dahlia Byes A, NP  betamethasone valerate lotion (VALISONE) 0.1 % Apply 1 application topically 2 (two) times daily. 05/19/19   Elvina Sidle, MD  carvedilol (COREG) 12.5 MG tablet Take 1 tablet (12.5 mg total) by mouth 2 (two) times daily with a meal. 04/21/20   Hendricks Schwandt A, NP  cephALEXin (KEFLEX) 250 MG capsule Take 1 capsule (250 mg total) by mouth 2 (two) times daily. 02/25/20   Wallis Bamberg, PA-C  nitrofurantoin, macrocrystal-monohydrate, (MACROBID) 100 MG capsule Take 1 capsule (100 mg total) by mouth 2 (two) times daily. 02/27/20   Merrilee Jansky, MD  omeprazole (PRILOSEC) 20 MG capsule Take 1 capsule (20 mg total) by mouth daily. 03/21/19   Georgetta Haber, NP  selenium sulfide (SELSUN) 2.5 % shampoo Apply 1 application topically daily as needed for irritation. 03/21/19   Linus Mako B, NP  triamcinolone (KENALOG) 0.025 % ointment Apply 1 application topically 2 (two) times daily as needed. 03/31/20   Particia Nearing,  PA-C    Family History Family History  Family history unknown: Yes    Social History Social History   Tobacco Use  . Smoking status: Never Smoker  . Smokeless tobacco: Never Used  Vaping Use  . Vaping Use: Never used  Substance Use Topics  . Alcohol use: Not Currently  . Drug use: Not Currently     Allergies   Patient has no known allergies.   Review of Systems Review of Systems   Physical Exam Triage Vital Signs ED Triage Vitals  Enc Vitals Group     BP 04/21/20 0912 (!) 158/91     Pulse Rate 04/21/20 0912 60     Resp 04/21/20 0912 18     Temp 04/21/20 0912 98.8 F (37.1 C)     Temp Source 04/21/20 0912 Oral     SpO2 04/21/20 0912 98 %     Weight --      Height --      Head Circumference --      Peak Flow --      Pain Score 04/21/20 0911 0     Pain Loc --      Pain Edu? --      Excl. in GC? --    No data found.  Updated Vital Signs BP (!) 158/91 (BP Location: Right Arm)   Pulse 60  Temp 98.8 F (37.1 C) (Oral)   Resp 18   SpO2 98%   Visual Acuity Right Eye Distance:   Left Eye Distance:   Bilateral Distance:    Right Eye Near:   Left Eye Near:    Bilateral Near:     Physical Exam Vitals and nursing note reviewed.  Constitutional:      General: She is not in acute distress.    Appearance: Normal appearance. She is not ill-appearing, toxic-appearing or diaphoretic.  HENT:     Head: Normocephalic.     Nose: Nose normal.  Eyes:     Conjunctiva/sclera: Conjunctivae normal.  Pulmonary:     Effort: Pulmonary effort is normal.  Musculoskeletal:        General: Normal range of motion.     Cervical back: Normal range of motion.  Skin:    General: Skin is warm and dry.     Findings: No rash.  Neurological:     Mental Status: She is alert.  Psychiatric:        Mood and Affect: Mood normal.      UC Treatments / Results  Labs (all labs ordered are listed, but only abnormal results are displayed) Labs Reviewed - No data to  display  EKG   Radiology No results found.  Procedures Procedures (including critical care time)  Medications Ordered in UC Medications - No data to display  Initial Impression / Assessment and Plan / UC Course  I have reviewed the triage vital signs and the nursing notes.  Pertinent labs & imaging results that were available during my care of the patient were reviewed by me and considered in my medical decision making (see chart for details).     Med refill  Refilling BP medicines as requested.  Contacts given for PCP for follow up.   Final Clinical Impressions(s) / UC Diagnoses   Final diagnoses:  Medication refill     Discharge Instructions     I have refilled your blood pressure medicine I have given you contact for primary care for follow up Please call them today.      ED Prescriptions    Medication Sig Dispense Auth. Provider   amLODipine (NORVASC) 10 MG tablet Take 1 tablet (10 mg total) by mouth daily. 30 tablet Malee Grays A, NP   carvedilol (COREG) 12.5 MG tablet Take 1 tablet (12.5 mg total) by mouth 2 (two) times daily with a meal. 30 tablet Kylei Purington A, NP     PDMP not reviewed this encounter.   Janace Aris, NP 04/21/20 1353

## 2020-05-12 ENCOUNTER — Ambulatory Visit (INDEPENDENT_AMBULATORY_CARE_PROVIDER_SITE_OTHER): Payer: BC Managed Care – PPO | Admitting: Student

## 2020-05-12 VITALS — BP 128/62 | HR 64 | Temp 98.2°F | Ht 62.0 in | Wt 143.6 lb

## 2020-05-12 DIAGNOSIS — I10 Essential (primary) hypertension: Secondary | ICD-10-CM | POA: Insufficient documentation

## 2020-05-12 DIAGNOSIS — Z0000 Encounter for general adult medical examination without abnormal findings: Secondary | ICD-10-CM

## 2020-05-12 DIAGNOSIS — R739 Hyperglycemia, unspecified: Secondary | ICD-10-CM | POA: Insufficient documentation

## 2020-05-12 MED ORDER — CARVEDILOL 12.5 MG PO TABS: 12.5000 mg | ORAL_TABLET | Freq: Two times a day (BID) | ORAL | 1 refills | Status: DC

## 2020-05-12 MED ORDER — AMLODIPINE BESYLATE 10 MG PO TABS: 10.0000 mg | ORAL_TABLET | Freq: Every day | ORAL | 1 refills | Status: DC

## 2020-05-12 NOTE — Assessment & Plan Note (Signed)
Patient had hyperglycemia (130) seen on last BMP. Will schedule lab only visit next week to check HbA1c and assess glycemic control.  Plan: -f/u HbA1c

## 2020-05-12 NOTE — Progress Notes (Signed)
CC: establish PCP, medication refills  HPI:  Becky Gross is a 64 y.o. female with history listed below presenting to [facility] to establish PCP and for medication refills. Please see individualized A&P for full HPI.  Past Medical History:  Diagnosis Date  . Hypertension    Current Outpatient Medications on File Prior to Visit  Medication Sig Dispense Refill  . acetaminophen (TYLENOL) 500 MG tablet Take 2 tablets (1,000 mg total) by mouth every 8 (eight) hours as needed for moderate pain. 30 tablet 0  . betamethasone valerate lotion (VALISONE) 0.1 % Apply 1 application topically 2 (two) times daily. 60 mL 2  . cephALEXin (KEFLEX) 250 MG capsule Take 1 capsule (250 mg total) by mouth 2 (two) times daily. 10 capsule 0  . nitrofurantoin, macrocrystal-monohydrate, (MACROBID) 100 MG capsule Take 1 capsule (100 mg total) by mouth 2 (two) times daily. 10 capsule 0  . omeprazole (PRILOSEC) 20 MG capsule Take 1 capsule (20 mg total) by mouth daily. 30 capsule 0  . selenium sulfide (SELSUN) 2.5 % shampoo Apply 1 application topically daily as needed for irritation. 118 mL 2  . triamcinolone (KENALOG) 0.025 % ointment Apply 1 application topically 2 (two) times daily as needed. 30 g 0   No current facility-administered medications on file prior to visit.   No Known Allergies  Family History  Family history unknown: Yes   Social History   Socioeconomic History  . Marital status: Legally Separated    Spouse name: Not on file  . Number of children: Not on file  . Years of education: Not on file  . Highest education level: Not on file  Occupational History  . Not on file  Tobacco Use  . Smoking status: Never Smoker  . Smokeless tobacco: Never Used  Vaping Use  . Vaping Use: Never used  Substance and Sexual Activity  . Alcohol use: Not Currently  . Drug use: Not Currently  . Sexual activity: Not on file  Other Topics Concern  . Not on file  Social History Narrative  . Not on  file   Social Determinants of Health   Financial Resource Strain:   . Difficulty of Paying Living Expenses: Not on file  Food Insecurity:   . Worried About Programme researcher, broadcasting/film/video in the Last Year: Not on file  . Ran Out of Food in the Last Year: Not on file  Transportation Needs:   . Lack of Transportation (Medical): Not on file  . Lack of Transportation (Non-Medical): Not on file  Physical Activity:   . Days of Exercise per Week: Not on file  . Minutes of Exercise per Session: Not on file  Stress:   . Feeling of Stress : Not on file  Social Connections:   . Frequency of Communication with Friends and Family: Not on file  . Frequency of Social Gatherings with Friends and Family: Not on file  . Attends Religious Services: Not on file  . Active Member of Clubs or Organizations: Not on file  . Attends Banker Meetings: Not on file  . Marital Status: Not on file  Intimate Partner Violence:   . Fear of Current or Ex-Partner: Not on file  . Emotionally Abused: Not on file  . Physically Abused: Not on file  . Sexually Abused: Not on file     Review of Systems:   Negative aside from that listed in individualized A&P  Physical Exam:  Vitals:   05/12/20 0933  BP:  128/62  Pulse: 64  Temp: 98.2 F (36.8 C)  TempSrc: Oral  SpO2: 100%  Weight: 143 lb 9.6 oz (65.1 kg)  Height: 5\' 2"  (1.575 m)   Physical Exam Constitutional:      Appearance: She is obese.  HENT:     Head: Normocephalic and atraumatic.     Mouth/Throat:     Mouth: Mucous membranes are moist.     Pharynx: Oropharynx is clear.  Eyes:     Extraocular Movements: Extraocular movements intact.     Conjunctiva/sclera: Conjunctivae normal.     Pupils: Pupils are equal, round, and reactive to light.  Cardiovascular:     Rate and Rhythm: Normal rate and regular rhythm.     Pulses: Normal pulses.     Heart sounds: Normal heart sounds. No murmur heard.  No friction rub. No gallop.   Pulmonary:      Effort: Pulmonary effort is normal.     Breath sounds: Normal breath sounds. No wheezing, rhonchi or rales.  Abdominal:     General: Bowel sounds are normal. There is no distension.     Palpations: Abdomen is soft.     Tenderness: There is no abdominal tenderness. There is no guarding or rebound.  Musculoskeletal:        General: No swelling. Normal range of motion.     Cervical back: Normal range of motion.  Skin:    General: Skin is warm and dry.     Findings: No rash.  Neurological:     General: No focal deficit present.     Mental Status: She is alert and oriented to person, place, and time.  Psychiatric:        Mood and Affect: Mood normal.        Behavior: Behavior normal.      Assessment & Plan:   See Encounters Tab for problem based charting.  Patient seen with Dr. 

## 2020-05-12 NOTE — Assessment & Plan Note (Addendum)
Patient with history of HTN on norvasc 10mg  daily and coreg 12.5mg  BID. BP today 128/62. Patient feels good today. Asked if we can remove blood pressure medication, but we advised to keep taking both of them as her BP is at goal with therapy. She is not amenable to checking labs today as she is very tired from working night shift so we will schedule a lab only visit next week for her to check BMP to assess kidney function (last checked over a year ago) and lipid panel.  Plan: -obtain BMP and lipid profile -continue current medications, refilled today

## 2020-05-12 NOTE — Assessment & Plan Note (Signed)
Patient reports having received flu shot, COVID vaccine x2 and booster.  She reports having performed mammogram (2020) and colonoscopy (in 2019). She does not have any paperwork to verify these screening tests, so advised her to bring them with her next time so that they can be placed into Epic.

## 2020-05-12 NOTE — Patient Instructions (Signed)
Becky Gross,  It was a pleasure seeing you in the clinic today.   We discussed the following today:  1. We will make a lab only visit to get your blood work next week. Someone will call you to schedule this.  Please call our clinic at [phone] if you have any questions or concerns. The best time to call is Monday-Friday from 9am-4pm, but there is someone available 24/7 at the same number. If you need medication refills, please notify your pharmacy one week in advance and they will send [place] a request.   Thank you for letting us take part in your care. We look forward to seeing you next time!

## 2020-05-15 NOTE — Progress Notes (Signed)
Internal Medicine Clinic Attending  I saw and evaluated the patient.  I personally confirmed the key portions of the history and exam documented by Dr. Jinwala and I reviewed pertinent patient test results.  The assessment, diagnosis, and plan were formulated together and I agree with the documentation in the resident's note.  

## 2020-05-20 ENCOUNTER — Other Ambulatory Visit (INDEPENDENT_AMBULATORY_CARE_PROVIDER_SITE_OTHER): Payer: BC Managed Care – PPO

## 2020-05-20 DIAGNOSIS — I10 Essential (primary) hypertension: Secondary | ICD-10-CM

## 2020-05-20 DIAGNOSIS — R739 Hyperglycemia, unspecified: Secondary | ICD-10-CM

## 2020-05-21 LAB — BMP8+ANION GAP
Anion Gap: 17 mmol/L (ref 10.0–18.0)
BUN/Creatinine Ratio: 16 (ref 12–28)
BUN: 11 mg/dL (ref 8–27)
CO2: 22 mmol/L (ref 20–29)
Calcium: 9.3 mg/dL (ref 8.7–10.3)
Chloride: 101 mmol/L (ref 96–106)
Creatinine, Ser: 0.7 mg/dL (ref 0.57–1.00)
GFR calc Af Amer: 105 mL/min/{1.73_m2} (ref >59)
GFR calc non Af Amer: 91 mL/min/{1.73_m2} (ref >59)
Glucose: 90 mg/dL (ref 65–99)
Potassium: 3.9 mmol/L (ref 3.5–5.2)
Sodium: 140 mmol/L (ref 134–144)

## 2020-05-21 LAB — HEMOGLOBIN A1C
Est. average glucose Bld gHb Est-mCnc: 134 mg/dL
Hgb A1c MFr Bld: 6.3 % — ABNORMAL HIGH (ref 4.8–5.6)

## 2020-05-21 LAB — LIPID PANEL
Chol/HDL Ratio: 2.9 ratio (ref 0.0–4.4)
Cholesterol, Total: 214 mg/dL — ABNORMAL HIGH (ref 100–199)
HDL: 75 mg/dL (ref >39)
LDL Chol Calc (NIH): 125 mg/dL — ABNORMAL HIGH (ref 0–99)
Triglycerides: 78 mg/dL (ref 0–149)
VLDL Cholesterol Cal: 14 mg/dL (ref 5–40)

## 2020-05-25 ENCOUNTER — Telehealth: Payer: Self-pay

## 2020-05-25 NOTE — Telephone Encounter (Signed)
Pt states amLODipine (NORVASC) 10 MG tablet is not at the pharmacy. Pt would like a call back from the nurse. Marland Kitchen

## 2020-05-25 NOTE — Telephone Encounter (Signed)
TC to [employer], spoke with pharmacy tech who states amlodipine and Coreg are both ready for patient to pick up and are $4 each. TC to patient, notified of above. Patient requesting an appt for jaw pain with movement.  Denies tooth pain. Appt made for 05/27/20 @ 0845/red team/Seawell SChaplin, RN,BSN

## 2020-05-27 ENCOUNTER — Other Ambulatory Visit: Payer: BC Managed Care – PPO

## 2020-05-27 ENCOUNTER — Other Ambulatory Visit: Payer: Self-pay

## 2020-05-27 ENCOUNTER — Encounter: Payer: Self-pay | Admitting: Internal Medicine

## 2020-05-27 ENCOUNTER — Ambulatory Visit (INDEPENDENT_AMBULATORY_CARE_PROVIDER_SITE_OTHER): Payer: BC Managed Care – PPO | Admitting: Internal Medicine

## 2020-05-27 VITALS — BP 144/73 | HR 57 | Temp 98.6°F | Wt 140.3 lb

## 2020-05-27 DIAGNOSIS — K5900 Constipation, unspecified: Secondary | ICD-10-CM | POA: Diagnosis not present

## 2020-05-27 DIAGNOSIS — I10 Essential (primary) hypertension: Secondary | ICD-10-CM

## 2020-05-27 DIAGNOSIS — M26609 Unspecified temporomandibular joint disorder, unspecified side: Secondary | ICD-10-CM

## 2020-05-27 DIAGNOSIS — R7303 Prediabetes: Secondary | ICD-10-CM

## 2020-05-27 DIAGNOSIS — Z0000 Encounter for general adult medical examination without abnormal findings: Secondary | ICD-10-CM

## 2020-05-27 DIAGNOSIS — E785 Hyperlipidemia, unspecified: Secondary | ICD-10-CM | POA: Insufficient documentation

## 2020-05-27 DIAGNOSIS — M2669 Other specified disorders of temporomandibular joint: Secondary | ICD-10-CM

## 2020-05-27 DIAGNOSIS — E782 Mixed hyperlipidemia: Secondary | ICD-10-CM | POA: Diagnosis not present

## 2020-05-27 HISTORY — DX: Unspecified temporomandibular joint disorder, unspecified side: M26.609

## 2020-05-27 MED ORDER — ACETAMINOPHEN 500 MG PO TABS: 500.0000 mg | ORAL_TABLET | Freq: Three times a day (TID) | ORAL | 0 refills | Status: DC | PRN

## 2020-05-27 MED ORDER — ATORVASTATIN CALCIUM 40 MG PO TABS: 40.0000 mg | ORAL_TABLET | Freq: Every day | ORAL | 11 refills | Status: DC

## 2020-05-27 MED ORDER — POLYETHYLENE GLYCOL 3350 17 G PO PACK: 17.0000 g | PACK | Freq: Every day | ORAL | 5 refills | Status: AC

## 2020-05-27 NOTE — Patient Instructions (Signed)
Thank you for allowing [place] to provide your care today. Today we discussed your jaw pain and high cholesterol and constipation.    Please START taking  miralax - take 17 grams daily as needed for constipation   Tylenol 500 mg - take 1-2 tablets every 8 hours as needed for jaw pain   Atorvastatin 40 mg - take 1 tablet daily   Please follow-up in two weeks to reassess jaw pain. Please bring your blood pressure readings.    Please call the internal medicine center clinic if you have any questions or concerns, we may be able to help and keep you from a long and expensive emergency room wait. Our clinic and after hours phone number is [phone], the best time to call is Monday through Friday 9 am to 4 pm but there is always someone available 24/7 if you have an emergency. If you need medication refills please notify your pharmacy one week in advance and they will send [place] a request.

## 2020-05-27 NOTE — Assessment & Plan Note (Signed)
Endorses having flu shot and covid shot recently. Requested she bring in records for this and her colonoscopy and pap smear

## 2020-05-27 NOTE — Assessment & Plan Note (Signed)
LDL 125, cholesterol 214, HDL 75. ASCVD 10 year risk 9.3%. She is very interested in lifestyle modification, discussed dietary changes but does not want to meet with dietician today.   - start atorvastatin 40 mg qd  - decrease fried foods and foods cooked in lots of oil.

## 2020-05-27 NOTE — Assessment & Plan Note (Signed)
BP Readings from Last 3 Encounters:  05/27/20 (!) 151/69  05/12/20 128/62  04/21/20 (!) 158/91   Blood pressure elevated today. She is taking coreg 12.5 mg bid and norvasc 10 mg qd. HR 57. Repeat 144/73. She states she checks her blood pressure at home and this is always around 110.   - continue to monitor blood pressure at home, bring in readings at follow-up in two weeks - continue current medications

## 2020-05-27 NOTE — Assessment & Plan Note (Signed)
A1c 6.3. Discussed lifestyle modification. She has been eating a lot of cinnamon sugar bread, which she will cut out. She has bought a glucose meter and is keeping track of her blood sugar.   - provided diabetes education  - check a1c in 3 months

## 2020-05-27 NOTE — Progress Notes (Signed)
   CC: jaw pain  HPI:  [name] is a 64 y.o. with PMH as below.   Please see A&P for assessment of the patient's acute and chronic medical conditions.   Past Medical History:  Diagnosis Date  . Hypertension    Review of Systems:   Review of Systems  HENT: Negative for congestion, ear discharge, ear pain, sinus pain, sore throat and tinnitus.        Jaw pain with clicking with opening the jaw  Respiratory: Negative for cough and shortness of breath.   Cardiovascular: Negative for chest pain and leg swelling.  Musculoskeletal: Positive for joint pain. Negative for neck pain.  Neurological: Negative for dizziness, tingling and headaches.  Psychiatric/Behavioral: Negative for depression. The patient is nervous/anxious.    Physical Exam:   Constitution: NAD, appears stated age HENT: [place]/AT, clicking of jaw with leftward deviation with opening of jaw; tenderness bilaterally along mandibular ramus and condyle; no temporal tenderness Eyes: no icterus or injection  Cardio: bradycardic, regular rhythm, no m/r/g, no LE edema  Respiratory: non-labored breathing, on room air Abdominal: NTTP, soft, non-distended Neuro: normal affect, a&ox3 Skin: c/d/i    Vitals:   05/27/20 0834  BP: (!) 151/69  Pulse: (!) 57  Temp: 98.6 F (37 C)  TempSrc: Oral  SpO2: 99%  Weight: 140 lb 4.8 oz (63.6 kg)     Assessment & Plan:   See Encounters Tab for problem based charting.  Patient discussed with Dr. Oswaldo Done

## 2020-05-27 NOTE — Assessment & Plan Note (Signed)
Jaw pain for the past week 3/10 with clicking. No bruxism during the day, unsure if at night. No temporal pain, neck pain, recent injury. She chews gum throughout the day. She does have stress sometimes with her job and family away in the [place] Symptoms and PE consistent with TMJ, jaw clicking with leftward deviation on jaw opening.   - jaw rest, eat soft foods, alternate ice and heat, stop chewing gum - gentle exercises given  - tylenol prn

## 2020-05-28 NOTE — Progress Notes (Signed)
Internal Medicine Clinic Attending  Case discussed with Dr. Seawell  At the time of the visit.  We reviewed the resident's history and exam and pertinent patient test results.  I agree with the assessment, diagnosis, and plan of care documented in the resident's note.  

## 2020-06-15 ENCOUNTER — Encounter: Payer: Self-pay | Admitting: Internal Medicine

## 2020-06-15 ENCOUNTER — Ambulatory Visit (INDEPENDENT_AMBULATORY_CARE_PROVIDER_SITE_OTHER): Payer: BC Managed Care – PPO | Admitting: Internal Medicine

## 2020-06-15 ENCOUNTER — Other Ambulatory Visit: Payer: Self-pay

## 2020-06-15 VITALS — BP 143/69 | HR 63 | Temp 98.6°F | Wt 142.9 lb

## 2020-06-15 DIAGNOSIS — M26609 Unspecified temporomandibular joint disorder, unspecified side: Secondary | ICD-10-CM | POA: Diagnosis not present

## 2020-06-15 DIAGNOSIS — M2669 Other specified disorders of temporomandibular joint: Secondary | ICD-10-CM | POA: Diagnosis not present

## 2020-06-15 MED ORDER — CYCLOBENZAPRINE HCL 5 MG PO TABS: 5.0000 mg | ORAL_TABLET | Freq: Three times a day (TID) | ORAL | 0 refills | Status: DC | PRN

## 2020-06-15 NOTE — Patient Instructions (Addendum)
Today we made the following changes to your medications:   Please START taking  Cyclobenzaprine 5 mg - take 1 tablet every 8 hours as needed for jaw pain.   I have placed a referral to the Ear, Nose and Throat doctor. They will call you for an appointment.   Please call the internal medicine center clinic if you have any questions or concerns, we may be able to help and keep you from a long and expensive emergency room wait. Our clinic and after hours phone number is [phone], the best time to call is Monday through Friday 9 am to 4 pm but there is always someone available 24/7 if you have an emergency. If you need medication refills please notify your pharmacy one week in advance and they will send [place] a request.

## 2020-06-15 NOTE — Progress Notes (Signed)
   CC: temporomandibular joint pain  HPI:  [name] is a 64 y.o. with PMH as below.   Please see A&P for assessment of the patient's acute and chronic medical conditions.   Past Medical History:  Diagnosis Date  . Hypertension    Review of Systems:  Review of Systems  Constitutional: Negative for chills, fever and malaise/fatigue.  HENT: Negative for congestion, ear discharge, ear pain, hearing loss, nosebleeds, sinus pain and tinnitus.   Eyes: Negative for blurred vision and double vision.  Musculoskeletal:       +jaw pain, clicking jaw  Neurological: Negative for dizziness and headaches.   Physical Exam: Constitution: NAD, appears stated age HENT: MMM, tenderness over mandible, no temporal tenderness, difficulty opening jaw completely, pain with movement, MMM Eyes: no icterus or injection   Respiratory: on room air, non-labored breathing Neuro: normal affect, a&ox3 Skin: c/d/i    Vitals:   06/15/20 0831  BP: (!) 143/69  Pulse: 63  Temp: 98.6 F (37 C)  TempSrc: Oral  SpO2: 100%  Weight: 142 lb 14.4 oz (64.8 kg)     Assessment & Plan:   See Encounters Tab for problem based charting.  Patient discussed with Dr. Criselda Peaches

## 2020-06-15 NOTE — Assessment & Plan Note (Signed)
Continues to have jaw pain and clicking when eating. The pain is worse and makes it difficult to open her mouth and eat. She has not been chewing gum and has been trying to eat softer foods. She is taking NSAIDs occasionally but does not want to take a lot of medication. Today she is unable to open her jaw completely and has significant pain trying to do so. She has tenderness to the proximal mandible bilaterally, no temporal tenderness. No swelling or deformity  - cont. NSAIDs prn, discussed small amounts are ok  - flexeril 5 mg tid prn  - referral sent to ENT

## 2020-06-16 NOTE — Progress Notes (Signed)
Internal Medicine Clinic Attending  Case discussed with Dr. Seawell  At the time of the visit.  We reviewed the resident's history and exam and pertinent patient test results.  I agree with the assessment, diagnosis, and plan of care documented in the resident's note.  

## 2020-06-24 ENCOUNTER — Telehealth: Payer: Self-pay

## 2020-06-24 MED ORDER — CARVEDILOL 12.5 MG PO TABS: 12.5000 mg | ORAL_TABLET | Freq: Two times a day (BID) | ORAL | 2 refills | Status: DC

## 2020-06-24 NOTE — Telephone Encounter (Signed)
Okay, I have refilled her carvedilol. Tried to call her to see what other meds she needed but unable to reach her.

## 2020-06-24 NOTE — Telephone Encounter (Signed)
Patient is out of her Carvedilol. Carvedilol 12.5mg  twice daily dosing was sent on 05/12/20, but only #30 tabs were sent. Patient is upset because she is out of medicine. Please fix quantity amount and resent RX to [facility]. Thank you, SChaplin, RN,BSN

## 2020-07-02 ENCOUNTER — Emergency Department (HOSPITAL_COMMUNITY): Payer: BC Managed Care – PPO

## 2020-07-02 ENCOUNTER — Other Ambulatory Visit: Payer: Self-pay

## 2020-07-02 ENCOUNTER — Encounter (HOSPITAL_COMMUNITY): Payer: Self-pay

## 2020-07-02 ENCOUNTER — Emergency Department (HOSPITAL_COMMUNITY)
Admission: EM | Admit: 2020-07-02 | Discharge: 2020-07-02 | Disposition: A | Discharge status: Home / Self Care | Payer: BC Managed Care – PPO | Attending: Emergency Medicine | Admitting: Emergency Medicine

## 2020-07-02 DIAGNOSIS — I10 Essential (primary) hypertension: Secondary | ICD-10-CM | POA: Diagnosis not present

## 2020-07-02 DIAGNOSIS — T148XXA Other injury of unspecified body region, initial encounter: Secondary | ICD-10-CM

## 2020-07-02 DIAGNOSIS — M549 Dorsalgia, unspecified: Secondary | ICD-10-CM | POA: Insufficient documentation

## 2020-07-02 DIAGNOSIS — X500XXA Overexertion from strenuous movement or load, initial encounter: Secondary | ICD-10-CM | POA: Diagnosis not present

## 2020-07-02 DIAGNOSIS — Z79899 Other long term (current) drug therapy: Secondary | ICD-10-CM | POA: Insufficient documentation

## 2020-07-02 DIAGNOSIS — Y92512 Supermarket, store or market as the place of occurrence of the external cause: Secondary | ICD-10-CM | POA: Insufficient documentation

## 2020-07-02 DIAGNOSIS — S299XXA Unspecified injury of thorax, initial encounter: Secondary | ICD-10-CM | POA: Diagnosis present

## 2020-07-02 DIAGNOSIS — S29011A Strain of muscle and tendon of front wall of thorax, initial encounter: Secondary | ICD-10-CM | POA: Insufficient documentation

## 2020-07-02 LAB — CBC
HCT: 41.7 % (ref 36.0–46.0)
Hemoglobin: 13.7 g/dL (ref 12.0–15.0)
MCH: 29.6 pg (ref 26.0–34.0)
MCHC: 32.9 g/dL (ref 30.0–36.0)
MCV: 90.1 fL (ref 80.0–100.0)
Platelets: 276 10*3/uL (ref 150–400)
RBC: 4.63 MIL/uL (ref 3.87–5.11)
RDW: 11.9 % (ref 11.5–15.5)
WBC: 6 10*3/uL (ref 4.0–10.5)
nRBC: 0 % (ref 0.0–0.2)

## 2020-07-02 LAB — BASIC METABOLIC PANEL
Anion gap: 12 (ref 5–15)
BUN: 12 mg/dL (ref 8–23)
CO2: 24 mmol/L (ref 22–32)
Calcium: 9.1 mg/dL (ref 8.9–10.3)
Chloride: 102 mmol/L (ref 98–111)
Creatinine, Ser: 0.75 mg/dL (ref 0.44–1.00)
GFR, Estimated: >60 mL/min (ref >60)
Glucose, Bld: 130 mg/dL — ABNORMAL HIGH (ref 70–99)
Potassium: 3.8 mmol/L (ref 3.5–5.1)
Sodium: 138 mmol/L (ref 135–145)

## 2020-07-02 LAB — TROPONIN I (HIGH SENSITIVITY): Troponin I (High Sensitivity): 4 ng/L (ref <18)

## 2020-07-02 MED ORDER — ACETAMINOPHEN 325 MG PO TABS: 650.0000 mg | ORAL_TABLET | Freq: Four times a day (QID) | ORAL | 0 refills | Status: DC | PRN

## 2020-07-02 MED ORDER — ACETAMINOPHEN 325 MG PO TABS
650.0000 mg | ORAL_TABLET | Freq: Once | ORAL | Status: AC
  Administered 2020-07-02: 650 mg via ORAL
  Filled 2020-07-02: qty 2

## 2020-07-02 MED ORDER — CYCLOBENZAPRINE HCL 10 MG PO TABS
5.0000 mg | ORAL_TABLET | Freq: Once | ORAL | Status: AC
  Administered 2020-07-02: 5 mg via ORAL
  Filled 2020-07-02: qty 1

## 2020-07-02 MED ORDER — IBUPROFEN 600 MG PO TABS: 600.0000 mg | ORAL_TABLET | Freq: Four times a day (QID) | ORAL | 0 refills | Status: DC | PRN

## 2020-07-02 MED ORDER — CYCLOBENZAPRINE HCL 10 MG PO TABS: 10.0000 mg | ORAL_TABLET | Freq: Every day | ORAL | 0 refills | Status: AC

## 2020-07-02 MED ORDER — IBUPROFEN 400 MG PO TABS
600.0000 mg | ORAL_TABLET | Freq: Once | ORAL | Status: AC
  Administered 2020-07-02: 600 mg via ORAL
  Filled 2020-07-02: qty 1

## 2020-07-02 NOTE — ED Triage Notes (Signed)
Pt reports Left side chest pain with lifting and deep breathing. Pt reports hx of the same 1 year ago. States, "I can't go home like this. 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

## 2020-07-02 NOTE — Discharge Instructions (Addendum)
Please follow up with your primary care provider this week.    You have a muscle strain in your chest and also your back, near your left shoulder blade.  For your muscle pain, you can take tylenol 650 mg every 6 hours, motrin 600 mg every 6 hours, and flexeril 10 mg before bedtime.  Flexeril will make you sleep.  Do not drive after taking flexeril.  You can also buy over-the-counter muscle rubs, like Ben-Gay or Icy-Hot.  Ask the pharmacist for help if you cannot find these.  Put some cream on the front of your chest and the back of your chest, near your left shoulder blade, where you are sore.  You can also use heat packs on your chest and back.

## 2020-07-02 NOTE — ED Provider Notes (Signed)
[facility] EMERGENCY DEPARTMENT Provider Note   CSN: [id number] Arrival date & time: 07/02/20  0747     History Chief Complaint  Patient presents with  . Chest Pain    Becky Gross is a 65 y.o. female presenting to emergency department with chest pain and back pain.  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  She was lifting a heavy object today and had a sudden sharp pain in the left side of her chest and in her back.  The pain is significantly worse with any arm movement.  As it feels similar to pain she had approximately 1 year ago doing the same activity.  At that time she responded very well to Flexeril.  He comes to the ED reporting "I cannot do my job like this, and I can go home and pain".  She denies any history of cardiac issues.  She denies chest pressure, nausea or vomiting.  She does feel short of breath as deep breathing seems to worsen her chest pain.  HPI     Past Medical History:  Diagnosis Date  . Hypertension     Patient Active Problem List   Diagnosis Date Noted  . Hyperlipidemia 05/27/2020  . Prediabetes 05/27/2020  . TMJ (temporomandibular joint disorder) 05/27/2020  . Hypertension 05/12/2020  . Hyperglycemia 05/12/2020  . Healthcare maintenance 05/12/2020    History reviewed. No pertinent surgical history.   OB History   No obstetric history on file.     Family History  Family history unknown: Yes    Social History   Tobacco Use  . Smoking status: Never Smoker  . Smokeless tobacco: Never Used  Vaping Use  . Vaping Use: Never used  Substance Use Topics  . Alcohol use: Not Currently  . Drug use: Not Currently    Home Medications Prior to Admission medications   Medication Sig Start Date End Date Taking? Authorizing Provider  acetaminophen (TYLENOL) 325 MG tablet Take 2 tablets (650 mg total) by mouth every 6 (six) hours as needed for up to 30 doses for mild pain or moderate pain. 07/02/20  Yes  Christofer Shen, Kermit Balo, MD  cyclobenzaprine (FLEXERIL) 10 MG tablet Take 1 tablet (10 mg total) by mouth at bedtime for 10 doses. 07/02/20 07/12/20 Yes Hansen Carino, Kermit Balo, MD  ibuprofen (ADVIL) 600 MG tablet Take 1 tablet (600 mg total) by mouth every 6 (six) hours as needed. 07/02/20  Yes Keshon Markovitz, Kermit Balo, MD  acetaminophen (TYLENOL) 500 MG tablet Take 1 tablet (500 mg total) by mouth every 8 (eight) hours as needed for moderate pain. 05/27/20   Seawell, Jaimie A, DO  amLODipine (NORVASC) 10 MG tablet Take 1 tablet (10 mg total) by mouth daily. 05/12/20   Merrilyn Puma, MD  atorvastatin (LIPITOR) 40 MG tablet Take 1 tablet (40 mg total) by mouth daily. 05/27/20   Seawell, Jaimie A, DO  betamethasone valerate lotion (VALISONE) 0.1 % Apply 1 application topically 2 (two) times daily. 05/19/19   Elvina Sidle, MD  carvedilol (COREG) 12.5 MG tablet Take 1 tablet (12.5 mg total) by mouth 2 (two) times daily with a meal. 06/24/20 09/22/20  Steffanie Rainwater, MD  cephALEXin (KEFLEX) 250 MG capsule Take 1 capsule (250 mg total) by mouth 2 (two) times daily. 02/25/20   Wallis Bamberg, PA-C  cyclobenzaprine (FLEXERIL) 5 MG tablet Take 1 tablet (5 mg total) by mouth 3 (three) times daily as needed for muscle spasms. 06/15/20   Guinevere Scarlet  A, DO  nitrofurantoin, macrocrystal-monohydrate, (MACROBID) 100 MG capsule Take 1 capsule (100 mg total) by mouth 2 (two) times daily. 02/27/20   Merrilee Jansky, MD  omeprazole (PRILOSEC) 20 MG capsule Take 1 capsule (20 mg total) by mouth daily. 03/21/19   Linus Mako B, NP  polyethylene glycol (MIRALAX) 17 g packet Take 17 g by mouth daily. 05/27/20   Seawell, Jaimie A, DO  selenium sulfide (SELSUN) 2.5 % shampoo Apply 1 application topically daily as needed for irritation. 03/21/19   Linus Mako B, NP  triamcinolone (KENALOG) 0.025 % ointment Apply 1 application topically 2 (two) times daily as needed. 03/31/20   Particia Nearing, PA-C    Allergies    Patient has no  known allergies.  Review of Systems   Review of Systems  Constitutional: Negative for chills and fever.  Eyes: Negative for pain and visual disturbance.  Respiratory: Positive for shortness of breath. Negative for cough.   Cardiovascular: Positive for chest pain. Negative for palpitations.  Gastrointestinal: Negative for abdominal pain and vomiting.  Musculoskeletal: Positive for arthralgias, back pain and myalgias.  Skin: Negative for color change and rash.  Neurological: Negative for syncope and numbness.  All other systems reviewed and are negative.   Physical Exam Updated Vital Signs BP (!) 164/67 (BP Location: Right Arm)   Pulse 64   Temp 98.6 F (37 C) (Oral)   Resp 18   Ht 5\' 1"  (1.549 m)   Wt 61.2 kg   SpO2 98%   BMI 25.51 kg/m   Physical Exam Constitutional:      General: She is not in acute distress. HENT:     Head: Normocephalic and atraumatic.  Eyes:     Conjunctiva/sclera: Conjunctivae normal.     Pupils: Pupils are equal, round, and reactive to light.  Cardiovascular:     Rate and Rhythm: Normal rate and regular rhythm.  Pulmonary:     Effort: Pulmonary effort is normal. No respiratory distress.  Abdominal:     General: There is no distension.     Tenderness: There is no abdominal tenderness.  Musculoskeletal:     Comments: Chest pain and back pain reproducible with movement Focal ttp below left scapula, pain worse with overhead arm raise  Skin:    General: Skin is warm and dry.  Neurological:     General: No focal deficit present.     Mental Status: She is alert and oriented to person, place, and time. Mental status is at baseline.  Psychiatric:        Mood and Affect: Mood normal.        Behavior: Behavior normal.     ED Results / Procedures / Treatments   Labs (all labs ordered are listed, but only abnormal results are displayed) Labs Reviewed  BASIC METABOLIC PANEL - Abnormal; Notable for the following components:      Result Value    Glucose, Bld 130 (*)    All other components within normal limits  CBC  TROPONIN I (HIGH SENSITIVITY)    EKG EKG Interpretation  Date/Time:  Friday July 02 2020 07:53:10 EST Ventricular Rate:  72 PR Interval:  126 QRS Duration: 80 QT Interval:  402 QTC Calculation: 440 R Axis:   84 Text Interpretation: Normal sinus rhythm Nonspecific ST abnormality Abnormal ECG No STEMI Confirmed by [id number] [phone]) on 07/02/2020 8:01:06 AM   Radiology DG Chest 2 View  Result Date: 07/02/2020 CLINICAL DATA:  65 year old female with left side chest  pain after lifting injury 3 days ago. Pleuritic pain and shortness of breath. EXAM: CHEST - 2 VIEW COMPARISON:  None. FINDINGS: Cardiac size at the upper limits of normal. Mildly tortuous thoracic aorta. Other mediastinal contours are within normal limits. Visualized tracheal air column is within normal limits. Lung volumes within normal limits. No pneumothorax, pulmonary edema, pleural effusion or confluent pulmonary opacity. EKG button artifact in the upper lungs. Negative visible bowel gas pattern. No acute osseous abnormality identified. IMPRESSION: No acute cardiopulmonary abnormality. Electronically Signed   By: Odessa Fleming M.D.   On: 07/02/2020 08:25    Procedures Procedures (including critical care time)  Medications Ordered in ED Medications  cyclobenzaprine (FLEXERIL) tablet 5 mg (has no administration in time range)  acetaminophen (TYLENOL) tablet 650 mg (has no administration in time range)  ibuprofen (ADVIL) tablet 600 mg (has no administration in time range)    ED Course  I have reviewed the triage vital signs and the nursing notes.  Pertinent labs & imaging results that were available during my care of the patient were reviewed by me and considered in my medical decision making (see chart for details).  This is a 65 year old female presenting to the emergency department with chest and back pain after lifting heavy object today.  I  am strongly suspicious of a muscle injury.  She has focal tenderness under the left scapula worse with overhead arm raise, suggestive possibly of a trapezius injury or rotator cuff injury.  She is able to perform full range of motion testing, and I doubt she has a significant tear.  However I would advise rest for 1 week and avoid any heavy lifting.  We can give her Tylenol, Motrin, Flexeril, and I advised topical muscle rubs as well.  I will offer her a work note.  The patient was triaged for chest pain, and therefore had triage blood work and an EKG done.  I personally reviewed these test.  Troponin is 4.  EKG shows normal sinus rhythm without acute ischemia.  Her other labs are unremarkable.  Her chest x-ray shows no sign of pneumothorax.  Clinically I have low suspicion that this is pain related to her heart, or that this is a PE, aortic dissection, or pneumothorax.     Final Clinical Impression(s) / ED Diagnoses Final diagnoses:  Muscle strain    Rx / DC Orders ED Discharge Orders         Ordered    ibuprofen (ADVIL) 600 MG tablet  Every 6 hours PRN        07/02/20 0850    acetaminophen (TYLENOL) 325 MG tablet  Every 6 hours PRN        07/02/20 0850    cyclobenzaprine (FLEXERIL) 10 MG tablet  Daily at bedtime        07/02/20 0850           Terald Sleeper, MD 07/02/20 [phone]

## 2020-07-23 ENCOUNTER — Other Ambulatory Visit: Payer: Self-pay | Admitting: Internal Medicine

## 2020-07-23 DIAGNOSIS — M2669 Other specified disorders of temporomandibular joint: Secondary | ICD-10-CM

## 2020-09-09 ENCOUNTER — Telehealth: Payer: Self-pay

## 2020-09-09 DIAGNOSIS — I10 Essential (primary) hypertension: Secondary | ICD-10-CM

## 2020-09-09 MED ORDER — AMLODIPINE BESYLATE 10 MG PO TABS
10.0000 mg | ORAL_TABLET | Freq: Every day | ORAL | 1 refills | Status: DC
Start: 2020-09-09 — End: 2020-10-29

## 2020-09-09 MED ORDER — CARVEDILOL 12.5 MG PO TABS: 12.5000 mg | ORAL_TABLET | Freq: Two times a day (BID) | ORAL | 1 refills | Status: DC

## 2020-09-09 NOTE — Telephone Encounter (Signed)
Return pt's call - needs refill Amlodipine and Coreg; upset because she has to call so often for refills. Stated I will ask the doctor to send a 90 day supply.

## 2020-09-09 NOTE — Telephone Encounter (Signed)
Pt is requesting a call back  About her medication she stated that the pharmacy did not give her her medicine

## 2020-10-07 ENCOUNTER — Telehealth: Payer: Self-pay

## 2020-10-07 NOTE — Telephone Encounter (Signed)
Pt is requesting her amLODipine (NORVASC) 10 MG tablet and carvedilol (COREG) 12.5 MG tablet and atorvastatin (LIPITOR) 40 MG tablet sent to  [id number] - [place] ([place]), [place] - [address] Phone:  [phone]  Fax:  [phone]

## 2020-10-07 NOTE — Telephone Encounter (Signed)
Atorvastatin refills are available at her pharmacy. Carvedilol and Amlodipine RX's were sent to pharmacy today. SChaplin, RN,BSN

## 2020-10-21 ENCOUNTER — Telehealth: Payer: Self-pay

## 2020-10-21 NOTE — Telephone Encounter (Signed)
Error

## 2020-10-29 ENCOUNTER — Ambulatory Visit (INDEPENDENT_AMBULATORY_CARE_PROVIDER_SITE_OTHER): Payer: BC Managed Care – PPO | Admitting: Internal Medicine

## 2020-10-29 VITALS — BP 145/72 | HR 56 | Temp 98.6°F | Wt 127.0 lb

## 2020-10-29 DIAGNOSIS — I10 Essential (primary) hypertension: Secondary | ICD-10-CM

## 2020-10-29 DIAGNOSIS — E785 Hyperlipidemia, unspecified: Secondary | ICD-10-CM | POA: Diagnosis not present

## 2020-10-29 DIAGNOSIS — R7303 Prediabetes: Secondary | ICD-10-CM | POA: Diagnosis not present

## 2020-10-29 DIAGNOSIS — Z0000 Encounter for general adult medical examination without abnormal findings: Secondary | ICD-10-CM

## 2020-10-29 MED ORDER — CARVEDILOL 12.5 MG PO TABS
12.5000 mg | ORAL_TABLET | Freq: Two times a day (BID) | ORAL | 5 refills | Status: DC
Start: 2020-10-29 — End: 2021-01-20

## 2020-10-29 MED ORDER — AMLODIPINE BESYLATE 10 MG PO TABS: 10.0000 mg | ORAL_TABLET | Freq: Every day | ORAL | 5 refills | Status: DC

## 2020-10-29 NOTE — Assessment & Plan Note (Signed)
Patient is on amlodipine 10 mg daily and Coreg 12.5 mg BID. She reports taking her medications with no issues. Denies any side effects. Denies headaches, chest pain, lightheadedness, sob, swelling, or other symptoms. She checks her blood pressures at home and reports that it's always around 110-115s, she does not have her meter with her. BP today is elevated. She reports it's always elevated when she comes into the clinic.  BP Readings from Last 3 Encounters:  10/29/20 (!) 145/72  07/02/20 (!) 143/70  06/15/20 (!) 143/69   Patient may have a component of white coat hypertension. Advised to monitor daily for the next month and bring in her readings and her monitor so that we can compare them to our monitor.  -Continue current medications -RTC in 1 month

## 2020-10-29 NOTE — Progress Notes (Signed)
   CC: HTN, HLD, prediabetes  HPI:  [name] is a 65 y.o. with history of hypertension, hyperlipidemia, and prediabetes who is presenting for hypertension, and hyperlipidemia.  Past Medical History:  Diagnosis Date  . Hypertension    Review of Systems:   Constitutional: Negative for chills and fever.  Respiratory: Negative for shortness of breath.   Cardiovascular: Negative for chest pain and leg swelling.  Gastrointestinal: Negative for abdominal pain, nausea and vomiting.  Neurological: Negative for dizziness and headaches.   Physical Exam:  Vitals:   10/29/20 0912  BP: (!) 162/69  Pulse: (!) 59  Temp: 98.6 F (37 C)  TempSrc: Oral  SpO2: 100%  Weight: 127 lb (57.6 kg)   Physical Exam HENT:     Head: Normocephalic and atraumatic.  Eyes:     Conjunctiva/sclera: Conjunctivae normal.     Pupils: Pupils are equal, round, and reactive to light.  Neck:     Thyroid: No thyromegaly.  Cardiovascular:     Rate and Rhythm: Normal rate and regular rhythm.     Heart sounds: Normal heart sounds. No murmur heard. No friction rub. No gallop.   Pulmonary:     Effort: Pulmonary effort is normal. No respiratory distress.     Breath sounds: Normal breath sounds. No wheezing.  Abdominal:     General: Bowel sounds are normal. There is no distension.     Palpations: Abdomen is soft.  Musculoskeletal:        General: Normal range of motion.     Cervical back: Normal range of motion and neck supple.  Skin:    General: Skin is warm and dry.     Findings: No erythema.  Neurological:     Mental Status: She is alert and oriented to person, place, and time.     Gait: Gait is intact.  Psychiatric:        Mood and Affect: Mood and affect normal.      Assessment & Plan:   See Encounters Tab for problem based charting.  Patient discussed with Dr. Oswaldo Done

## 2020-10-29 NOTE — Assessment & Plan Note (Signed)
Currently on atorvastatin 40 mg daily, denies any side effects to the medications. She has been taking it daily. She reported concern about taking atorvastatin, reported that she never had to take medications for cholesterol in the past. She stated that she has been eating a healthy diet and avoiding any fatty or fried foods. She does not smoke. She stated that she did not want to continue taking atorvastatin. We discussed the ASCVD risk score and what factors it includes, and the benefits of continuing the statin medication. Discussed that we will check her lipid panel today to see if she has had improvement and that I would likely continue to recommend taking the atorvastatin. She is agreeable to continuing the atorvastatin for now.   -Repeat lipid panel today -Continue atorvastatin 40 mg daily

## 2020-10-29 NOTE — Assessment & Plan Note (Signed)
Discussed history of pre-diabetes and recommended repeating A1c which patient declined.

## 2020-10-29 NOTE — Patient Instructions (Addendum)
[name],  It was a pleasure to see you today. Thank you for coming in.   Today we discussed your blood pressure. Please start recording your blood pressure daily, bring in the monitor with you on your next visit. I have refilled your medications.   We also discussed your cholesterol. I am checking the lipid panel today. I will call you with the results. Please continue taking the Atorvastatin for now.    Please return to clinic in 1 month or sooner if needed.   Thank you again for coming in.   Claudean Severance.D.

## 2020-10-29 NOTE — Assessment & Plan Note (Signed)
Updated COVID vaccine.  Reports she had a mammogram recently. Discussed colonoscopy and Dexa scan and patient was not interested in having this done.

## 2020-10-30 LAB — LIPID PANEL
Chol/HDL Ratio: 1.8 ratio (ref 0.0–4.4)
Cholesterol, Total: 121 mg/dL (ref 100–199)
HDL: 67 mg/dL (ref >39)
LDL Chol Calc (NIH): 44 mg/dL (ref 0–99)
Triglycerides: 39 mg/dL (ref 0–149)
VLDL Cholesterol Cal: 10 mg/dL (ref 5–40)

## 2020-11-01 NOTE — Progress Notes (Signed)
Internal Medicine Clinic Attending ° °Case discussed with Dr. Krienke  At the time of the visit.  We reviewed the resident’s history and exam and pertinent patient test results.  I agree with the assessment, diagnosis, and plan of care documented in the resident’s note.  °

## 2020-11-04 ENCOUNTER — Telehealth: Payer: Self-pay

## 2020-11-04 NOTE — Telephone Encounter (Signed)
I agree

## 2020-11-04 NOTE — Telephone Encounter (Signed)
Received TC from patient who states she is having chest pain X 2 days and wants an appt.  Patient describes pain as sharp and tight, also states her breathing is "different/deeper".  Pt informed office does not see active chest pain in the office and advised to go to the ED for emergent evaluation.  Pt verbalized understanding. SChaplin, RN,BSN

## 2020-11-12 ENCOUNTER — Ambulatory Visit: Payer: BC Managed Care – PPO | Admitting: Internal Medicine

## 2020-11-12 ENCOUNTER — Encounter: Payer: Self-pay | Admitting: Internal Medicine

## 2020-11-12 ENCOUNTER — Ambulatory Visit (HOSPITAL_COMMUNITY)
Admission: RE | Admit: 2020-11-12 | Discharge: 2020-11-12 | Disposition: A | Discharge status: Home / Self Care | Payer: BC Managed Care – PPO | Source: Ambulatory Visit | Attending: Internal Medicine | Admitting: Internal Medicine

## 2020-11-12 ENCOUNTER — Other Ambulatory Visit: Payer: Self-pay

## 2020-11-12 ENCOUNTER — Telehealth: Payer: Self-pay

## 2020-11-12 VITALS — BP 140/76 | HR 74 | Temp 98.1°F | Ht 60.0 in | Wt 130.2 lb

## 2020-11-12 DIAGNOSIS — R1013 Epigastric pain: Secondary | ICD-10-CM | POA: Insufficient documentation

## 2020-11-12 LAB — CBC
HCT: 39.3 % (ref 36.0–46.0)
Hemoglobin: 13.3 g/dL (ref 12.0–15.0)
MCH: 30.6 pg (ref 26.0–34.0)
MCHC: 33.8 g/dL (ref 30.0–36.0)
MCV: 90.6 fL (ref 80.0–100.0)
Platelets: 300 10*3/uL (ref 150–400)
RBC: 4.34 MIL/uL (ref 3.87–5.11)
RDW: 12 % (ref 11.5–15.5)
WBC: 8.4 10*3/uL (ref 4.0–10.5)
nRBC: 0 % (ref 0.0–0.2)

## 2020-11-12 LAB — TROPONIN I (HIGH SENSITIVITY): Troponin I (High Sensitivity): 6 ng/L (ref <18)

## 2020-11-12 LAB — BASIC METABOLIC PANEL
Anion gap: 8 (ref 5–15)
BUN: 20 mg/dL (ref 8–23)
CO2: 28 mmol/L (ref 22–32)
Calcium: 9.2 mg/dL (ref 8.9–10.3)
Chloride: 103 mmol/L (ref 98–111)
Creatinine, Ser: 0.78 mg/dL (ref 0.44–1.00)
GFR, Estimated: >60 mL/min (ref >60)
Glucose, Bld: 111 mg/dL — ABNORMAL HIGH (ref 70–99)
Potassium: 4.5 mmol/L (ref 3.5–5.1)
Sodium: 139 mmol/L (ref 135–145)

## 2020-11-12 MED ORDER — CYCLOBENZAPRINE HCL 10 MG PO TABS: 10.0000 mg | ORAL_TABLET | Freq: Every day | ORAL | 0 refills | Status: DC | PRN

## 2020-11-12 MED ORDER — IBUPROFEN 600 MG PO TABS: 600.0000 mg | ORAL_TABLET | Freq: Four times a day (QID) | ORAL | 0 refills | Status: DC | PRN

## 2020-11-12 NOTE — Progress Notes (Signed)
Internal Medicine Clinic Attending ° °Case discussed with Dr. Krienke  At the time of the visit.  We reviewed the resident’s history and exam and pertinent patient test results.  I agree with the assessment, diagnosis, and plan of care documented in the resident’s note.  °

## 2020-11-12 NOTE — Patient Instructions (Addendum)
Ms. Peniel Biel,  It was a pleasure to see you today. Thank you for coming in.   Today we discussed your abdominal pain. We did an EKG today that did not show any heart issues. I am checking some labs today. This is likely related to your mild trauma that you sustained. I refilled the flexeril and the ibuprofen. These are only for a short course, if your symptoms continue you will need to be re-evaluated.    We also discussed your cholesterol levels. Your last labs looked good. Please continue taking the atorvastatin 40 mg daily.   Bring in your blood pressure meter with you on your next visit.   Please return to clinic in 1 month or sooner if needed.   Thank you again for coming in.   Claudean Severance.D.

## 2020-11-12 NOTE — Assessment & Plan Note (Signed)
Patient reports that approximately 1 week ago where she started having some epigastric pain that she states felt like a muscle spasm.  It is located over the middle of her abdomen, with no radiation, improved with ibuprofen and worsened when she sat up or try to stand up.  It occurs every day.  She has been using Tylenol, ibuprofen, and Bengay and had some Flexeril from a similar episode earlier this year.  One of her coworkers gave her a fairly large hug before this started.  She denied any associated symptoms such as fevers, chills, nausea, vomiting, cough, shortness of breath, chest pain, headaches, lightheadedness, dizziness, fatigue, weakness, or other symptoms.  On exam patient's vitals are stable except mild hypertension, she has some mild tenderness to palpation over the epigastric area and right lower rib area, as well as pain with sitting up.  Symptoms seem to be most consistent with a muscle strain however given her hypertension, hyperlipidemia, prediabetes, age, and gender will obtain labs to rule out cardiac etiology. EKG showed normal sinus rhythm, normal axis, no prolonged PR or QT intervals, no acute ST changes.  -Obtain troponin, BMP, and CBC -Start Flexeril and ibuprofen, advised on short course -Advised to return to clinic if symptoms persist past 1-2 weeks

## 2020-11-12 NOTE — Progress Notes (Signed)
   CC: Abdominal pain  HPI:  [name] is a 65 y.o. with a history of HTN, HLD, prediabetes presenting for abdominal pain.   Past Medical History:  Diagnosis Date  . Hypertension    Review of Systems:   Constitutional: Negative for chills and fever.  Respiratory: Negative for shortness of breath.   Cardiovascular: Negative for chest pain and leg swelling.  Gastrointestinal: Negative for abdominal pain, nausea and vomiting.  MSK: Positive for epigastric pain and right lower rib pain.  Neurological: Negative for dizziness and headaches.   Physical Exam:  Vitals:   11/12/20 1052  BP: 140/76  Pulse: 74  Temp: 98.1 F (36.7 C)  TempSrc: Oral  SpO2: 100%  Weight: 130 lb 3.2 oz (59.1 kg)  Height: 5' (1.524 m)   Physical Exam Constitutional:      Appearance: Normal appearance.  Cardiovascular:     Rate and Rhythm: Normal rate and regular rhythm.     Pulses: Normal pulses.     Heart sounds: Normal heart sounds.  Pulmonary:     Effort: Pulmonary effort is normal.     Breath sounds: Normal breath sounds.  Abdominal:     General: Abdomen is flat. Bowel sounds are normal.     Palpations: Abdomen is soft.  Musculoskeletal:        General: Tenderness (Mild TTP over epigastric area and right lower rib) present. Normal range of motion.     Comments: Mild epigastric pain with sitting up  Skin:    General: Skin is warm and dry.     Capillary Refill: Capillary refill takes less than 2 seconds.  Neurological:     General: No focal deficit present.     Mental Status: She is alert and oriented to person, place, and time.  Psychiatric:        Mood and Affect: Mood normal.        Behavior: Behavior normal.      Assessment & Plan:   See Encounters Tab for problem based charting.  Patient discussed with Dr. Mikey Bussing

## 2020-11-12 NOTE — Telephone Encounter (Signed)
Walk-in; requesting pain med/muscle relaxers she received some time ago from ED for a "muscle strain" in upper epigastric area.  She states this area is sore to the touch and sore when she lies down on it.  Informed patient she will need to be evaluated.  Dr. Gwyneth Revels has an opening and she was added to her schedule. SChaplin, RN,BSN

## 2020-12-02 NOTE — Patient Instructions (Addendum)
Becky Gross,   Thank you for your visit to the [facility] today. Today we discussed the following:  1) Bilateral eye drainage, pain of skin around eyes - I think you likely have viral conjunctivitis. - I am placing a referral to an eye doctor. - In the meantime, try cool compresses and artificial tears, and try to keep your hands out of your eyes. Do not wear your artifical lashes until your symptoms improve.  - Wash your hands frequently.  Please schedule follow-up with your PCP in the next 1-3 months.    If you have any questions or concerns, please call our clinic at [phone] between 9am-5pm. Outside of these hours, call [phone] and ask for the internal medicine resident on call. If you feel you are having a medical emergency please call 911.

## 2020-12-02 NOTE — Progress Notes (Signed)
Office Visit   Patient ID: Becky Gross, female    DOB: 01/07/1956, 65 y.o.   MRN: [id number]   PCP: Merrilyn Puma, MD   Subjective:   CC: pain around eyes, eye drainage  HPI:  Becky Gross is a 65 y.o. woman with history as below who presents to clinic for 2-week history of pain around eyes R>L and bilateral eye drainage R>L. Her last clinic visit was on 11/12/20.   To see the details of this patient's management of their acute and chronic problems, please refer to the Assessment & Plan under the Encounters tab.   Review of Systems:   Review of Systems  Constitutional:  Negative for chills, fever and malaise/fatigue.  HENT:  Negative for congestion, sinus pain and sore throat.   Eyes:  Positive for discharge and redness. Negative for blurred vision, double vision and pain.  Respiratory:  Negative for cough.   Musculoskeletal:  Negative for myalgias.  Skin:  Positive for rash. Negative for itching.   Past Medical History:  Diagnosis Date   Hypertension       ACTIVE MEDICATIONS   Outpatient Medications Prior to Visit  Medication Sig Dispense Refill   acetaminophen (TYLENOL) 325 MG tablet Take 2 tablets (650 mg total) by mouth every 6 (six) hours as needed for up to 30 doses for mild pain or moderate pain. 30 tablet 0   amLODipine (NORVASC) 10 MG tablet Take 1 tablet (10 mg total) by mouth daily. 30 tablet 5   atorvastatin (LIPITOR) 40 MG tablet Take 1 tablet (40 mg total) by mouth daily. 30 tablet 11   carvedilol (COREG) 12.5 MG tablet Take 1 tablet (12.5 mg total) by mouth 2 (two) times daily with a meal. 60 tablet 5   cyclobenzaprine (FLEXERIL) 10 MG tablet Take 1 tablet (10 mg total) by mouth daily as needed for muscle spasms. 10 tablet 0   ibuprofen (ADVIL) 600 MG tablet Take 1 tablet (600 mg total) by mouth every 6 (six) hours as needed. 30 tablet 0   polyethylene glycol (MIRALAX) 17 g packet Take 17 g by mouth daily. 14 each 5   No facility-administered  medications prior to visit.   Objective:   BP 119/69 (BP Location: Left Arm, Patient Position: Sitting, Cuff Size: Normal)   Pulse 69   Temp 98.7 F (37.1 C) (Oral)   Ht 5' (1.524 m)   Wt 126 lb 12.8 oz (57.5 kg)   SpO2 100%   BMI 24.76 kg/m  Wt Readings from Last 3 Encounters:  12/03/20 126 lb 12.8 oz (57.5 kg)  11/12/20 130 lb 3.2 oz (59.1 kg)  10/29/20 127 lb (57.6 kg)   BP Readings from Last 3 Encounters:  12/03/20 119/69  11/12/20 140/76  10/29/20 (!) 145/72   Constitutional: well-appearing woman sitting in chair, in no acute distress HENT: normocephalic atraumatic, mucous membranes moist Eyes: (See images below or Media tab) mild conjunctival erythema R>L, white/yellow eye drainage R>L, no preauricular lymphadenopathy bilaterally, mild swelling under R eye Neck: supple Cardiovascular: no lower extremity edema Pulmonary/Chest: normal work of breathing on room air Abdominal: non-distended MSK: normal bulk and tone Neurological: alert & oriented x 3, normal gait Skin: warm and dry, mild erythema and dry skin between eyes Psych: normal mood and affect      Health Maintenance:   Health Maintenance  Topic Date Due   Hepatitis C Screening  Never done   TETANUS/TDAP  Never done   COLONOSCOPY (Pts 45-62yrs Insurance coverage will need  to be confirmed)  Never done   MAMMOGRAM  Never done   Zoster Vaccines- Shingrix (1 of 2) Never done   DEXA SCAN  Never done   PNA vac Low Risk Adult (1 of 2 - PCV13) Never done   COVID-19 Vaccine (4 - Booster for Pfizer series) 08/25/2020   INFLUENZA VACCINE  01/17/2021   HPV VACCINES  Aged Out     Assessment & Plan:   Problem List Items Addressed This Visit       Other   Healthcare maintenance    Discussed tetanus shot, Fit testing/colonoscopy, and DEXA scan again, and patient states she is not interested at this time. - address at future follow-up       Viral conjunctivitis of both eyes - Primary    Patient presents  with a 2-week history of eye redness and discharge. She reports she first noticed some redness and soreness on the outside corner of her R eye followed by redness and drainage in the R eye. The drainage is whitish-yellow and causes her eye to be stuck shut initially upon waking up in the morning. Over the last few days, the L eye has also become slightly pink and has some drainage, though less than the right. She denies associated fevers, chills, cough, congestion, rhinorrhea, sneezing, sore throat, cough, foreign body sensation, change in her vision, sick contacts. She does not wear contact lenses. States she does not have seasonal allergies She states she has been wearing false eyelashes daily since she was a teenager however has not been wearing them since her symptoms began. She has been applying a small amount of steroid cream to the red corners of the skin around her eyes and has been applying a warm compress to the R eye. Has otherwise not put anything in the eye or sought treatment elsewhere.   She is overall well-appearing. Exam notable for mild conjunctival erythema R>L, white/yellow eye drainage R>L, no preauricular lymphadenopathy bilaterally, mild swelling under R eye. See Media tab for pictures.  Assessment/Plan: I suspect the patient has bilateral viral conjunctivitis. Allergic conjunctivitis is also on the differential, however the patient has no other allergy symptoms and does not have itchy eyes. Bacterial conjunctivitis is a consideration, however bilateral presentation is very rare, and there would likely be more discharge. Will treat symptomatically and refer to ophthalmology as the patient may benefit from steroid eye drops. - Advised cool compresses and artificial tears, frequent hand washing, avoid false eye lashes until symptoms improve - Referral to ophthalmology; patient states she has the number of an office she would like to try        Relevant Orders   Ambulatory referral to  Ophthalmology     Return in about 3 months (around 03/05/2021).   Pt discussed with Dr. Heide Spark.  Alphonzo Severance, MD Internal Medicine Resident, PGY-1 [facility] Pager: [phone] 2:14 PM, 12/04/2020

## 2020-12-03 ENCOUNTER — Other Ambulatory Visit: Payer: Self-pay

## 2020-12-03 ENCOUNTER — Encounter: Payer: Self-pay | Admitting: Student

## 2020-12-03 ENCOUNTER — Ambulatory Visit: Payer: BC Managed Care – PPO | Admitting: Student

## 2020-12-03 VITALS — BP 119/69 | HR 69 | Temp 98.7°F | Ht 60.0 in | Wt 126.8 lb

## 2020-12-03 DIAGNOSIS — Z0000 Encounter for general adult medical examination without abnormal findings: Secondary | ICD-10-CM

## 2020-12-03 DIAGNOSIS — B309 Viral conjunctivitis, unspecified: Secondary | ICD-10-CM

## 2020-12-04 DIAGNOSIS — B309 Viral conjunctivitis, unspecified: Secondary | ICD-10-CM | POA: Insufficient documentation

## 2020-12-04 NOTE — Assessment & Plan Note (Signed)
Patient presents with a 2-week history of eye redness and discharge. She reports she first noticed some redness and soreness on the outside corner of her R eye followed by redness and drainage in the R eye. The drainage is whitish-yellow and causes her eye to be stuck shut initially upon waking up in the morning. Over the last few days, the L eye has also become slightly pink and has some drainage, though less than the right. She denies associated fevers, chills, cough, congestion, rhinorrhea, sneezing, sore throat, cough, foreign body sensation, change in her vision, sick contacts. She does not wear contact lenses. States she does not have seasonal allergies She states she has been wearing false eyelashes daily since she was a teenager however has not been wearing them since her symptoms began. She has been applying a small amount of steroid cream to the red corners of the skin around her eyes and has been applying a warm compress to the R eye. Has otherwise not put anything in the eye or sought treatment elsewhere.   She is overall well-appearing. Exam notable for mild conjunctival erythema R>L, white/yellow eye drainage R>L, no preauricular lymphadenopathy bilaterally, mild swelling under R eye. See Media tab for pictures.  Assessment/Plan: I suspect the patient has bilateral viral conjunctivitis. Allergic conjunctivitis is also on the differential, however the patient has no other allergy symptoms and does not have itchy eyes. Bacterial conjunctivitis is a consideration, however bilateral presentation is very rare, and there would likely be more discharge. Will treat symptomatically and refer to ophthalmology as the patient may benefit from steroid eye drops. - Advised cool compresses and artificial tears, frequent hand washing, avoid false eye lashes until symptoms improve - Referral to ophthalmology; patient states she has the number of an office she would like to try

## 2020-12-04 NOTE — Assessment & Plan Note (Signed)
Discussed tetanus shot, Fit testing/colonoscopy, and DEXA scan again, and patient states she is not interested at this time. - address at future follow-up

## 2020-12-06 NOTE — Progress Notes (Signed)
Internal Medicine Clinic Attending  Case discussed with Dr. Watson  At the time of the visit.  We reviewed the resident's history and exam and pertinent patient test results.  I agree with the assessment, diagnosis, and plan of care documented in the resident's note.  

## 2021-01-12 ENCOUNTER — Encounter: Payer: Self-pay | Admitting: Internal Medicine

## 2021-01-12 ENCOUNTER — Ambulatory Visit (INDEPENDENT_AMBULATORY_CARE_PROVIDER_SITE_OTHER): Payer: BC Managed Care – PPO | Admitting: Internal Medicine

## 2021-01-12 ENCOUNTER — Other Ambulatory Visit: Payer: Self-pay

## 2021-01-12 DIAGNOSIS — E782 Mixed hyperlipidemia: Secondary | ICD-10-CM

## 2021-01-12 DIAGNOSIS — M545 Low back pain, unspecified: Secondary | ICD-10-CM

## 2021-01-12 DIAGNOSIS — Z0000 Encounter for general adult medical examination without abnormal findings: Secondary | ICD-10-CM

## 2021-01-12 MED ORDER — CYCLOBENZAPRINE HCL 10 MG PO TABS: 10.0000 mg | ORAL_TABLET | Freq: Three times a day (TID) | ORAL | 0 refills | Status: DC | PRN

## 2021-01-12 NOTE — Assessment & Plan Note (Signed)
Becky Gross has several care about the need to be addressed.   PLAN: Follow-up with PCP.

## 2021-01-12 NOTE — Assessment & Plan Note (Signed)
Becky Gross had concerns about continuing her Lipitor 40 mg daily.  Last lipid profile in May revealed cholesterol levels and LDL levels within normal limits.  She would like to stop taking the Lipitor.  She was counseled extensively that Lipitor is for primary prevention of CVD and was previously started on Lipitor due to hyperlipidemia.  She was made aware of the risk and she acknowledges and understands. She reports that she eats healthy, exercise and would like to stop taking it.  PLAN: Gave the patient the option to stop taking it however on recheck of lipid profile next year if abnormal, will restart Lipitor 40 daily.

## 2021-01-12 NOTE — Patient Instructions (Addendum)
Will write work note  Take muscle relaxant to relief muscle spasms  Continue to walk for exercise to help relief low back pain  Try heating pad on low back

## 2021-01-12 NOTE — Progress Notes (Signed)
CC: low back pain  HPI:  Becky Gross is a 65 y.o. female with a past medical history stated below and presents today for low back pain.  [name] states that she works at [employer] and her job entails lifting heavy boxes and crates.  One week ago after lifting a heavy box [name] felt a pull sensation in her lower back.  Since then she has been experiencing low back pain with walking and lifting.  She describes the pain as 9 out of 10 pulling sensation.  At rest the pain is 5 out of 10.  The pain does not radiate.  She takes over-the-counter Tylenol and Advil with temporary relief.  Denies any recent falls.  Denies fever or chills.  Denies incontinence or any urinary changes.   Please see problem based assessment and plan for additional details.  Past Medical History:  Diagnosis Date   Hypertension     Current Outpatient Medications on File Prior to Visit  Medication Sig Dispense Refill   acetaminophen (TYLENOL) 325 MG tablet Take 2 tablets (650 mg total) by mouth every 6 (six) hours as needed for up to 30 doses for mild pain or moderate pain. 30 tablet 0   amLODipine (NORVASC) 10 MG tablet Take 1 tablet (10 mg total) by mouth daily. 30 tablet 5   atorvastatin (LIPITOR) 40 MG tablet Take 1 tablet (40 mg total) by mouth daily. 30 tablet 11   carvedilol (COREG) 12.5 MG tablet Take 1 tablet (12.5 mg total) by mouth 2 (two) times daily with a meal. 60 tablet 5   cyclobenzaprine (FLEXERIL) 10 MG tablet Take 1 tablet (10 mg total) by mouth daily as needed for muscle spasms. 10 tablet 0   ibuprofen (ADVIL) 600 MG tablet Take 1 tablet (600 mg total) by mouth every 6 (six) hours as needed. 30 tablet 0   polyethylene glycol (MIRALAX) 17 g packet Take 17 g by mouth daily. 14 each 5   No current facility-administered medications on file prior to visit.    Family History  Family history unknown: Yes    Social History   Socioeconomic History   Marital status: Legally Separated     Spouse name: Not on file   Number of children: Not on file   Years of education: Not on file   Highest education level: Not on file  Occupational History   Not on file  Tobacco Use   Smoking status: Never   Smokeless tobacco: Never  Vaping Use   Vaping Use: Never used  Substance and Sexual Activity   Alcohol use: Not Currently   Drug use: Not Currently   Sexual activity: Not on file  Other Topics Concern   Not on file  Social History Narrative   Not on file   Social Determinants of Health   Financial Resource Strain: Not on file  Food Insecurity: Not on file  Transportation Needs: Not on file  Physical Activity: Not on file  Stress: Not on file  Social Connections: Not on file  Intimate Partner Violence: Not on file    Review of Systems  Constitutional:  Negative for chills and fever.  Eyes:  Negative for blurred vision and double vision.  Respiratory:  Negative for shortness of breath and wheezing.   Cardiovascular:  Negative for chest pain, palpitations and leg swelling.  Gastrointestinal:  Negative for abdominal pain, constipation, diarrhea, heartburn, nausea and vomiting.  Genitourinary:  Negative for dysuria, frequency and urgency.  Musculoskeletal:  Positive  for back pain. Negative for falls.  Neurological:  Negative for dizziness and headaches.    Vitals:   01/12/21 0843  BP: 136/75  Pulse: 70  Temp: 98.7 F (37.1 C)  TempSrc: Oral  SpO2: 97%  Weight: 128 lb 1.6 oz (58.1 kg)  Height: 5' (1.524 m)     Physical Exam Constitutional:      Appearance: Normal appearance.  HENT:     Head: Normocephalic and atraumatic.  Eyes:     General: Lids are normal.  Cardiovascular:     Rate and Rhythm: Normal rate and regular rhythm.     Heart sounds: Normal heart sounds, S1 normal and S2 normal.  Pulmonary:     Effort: Pulmonary effort is normal.     Breath sounds: Normal breath sounds and air entry.  Abdominal:     General: Bowel sounds are normal.      Palpations: Abdomen is soft.     Tenderness: There is no abdominal tenderness.  Musculoskeletal:     Lumbar back: Spasms and tenderness present. No swelling or bony tenderness. Decreased range of motion.  Skin:    General: Skin is warm and dry.  Neurological:     General: No focal deficit present.     Mental Status: She is alert.  Psychiatric:        Attention and Perception: Attention normal.        Mood and Affect: Mood normal.        Behavior: Behavior normal. Behavior is cooperative.      Assessment & Plan:   See Encounters Tab for problem based charting.  Patient seen with Dr. Marylene Buerger, M.D. [facility] Internal Medicine, PGY-1 Pager: [phone], Phone: [phone] Date 01/12/2021 Time 9:49 AM

## 2021-01-12 NOTE — Assessment & Plan Note (Addendum)
Ms. Becky Gross has been experiencing acute low back pain for the past week.  The pain was brought on by lifting heavy box at work.  Described as a pulling sensation.  Tylenol and Advil provides temporary relief.   PLAN: Prescribed Flexeril 10 mg 3 times a day for muscle spasm. Instructed to use heating pads. Instructed to continue exercise by walking to help relieve pain. Patient requested work note.

## 2021-01-14 NOTE — Progress Notes (Signed)
Internal Medicine Clinic Attending  I saw and evaluated the patient.  I personally confirmed the key portions of the history and exam documented by Dr. Ariwodo and I reviewed pertinent patient test results.  The assessment, diagnosis, and plan were formulated together and I agree with the documentation in the resident's note.   

## 2021-01-14 NOTE — Addendum Note (Signed)
Addended by: Earl Lagos on: 01/14/2021 10:04 AM   Modules accepted: Level of Service

## 2021-01-19 ENCOUNTER — Other Ambulatory Visit: Payer: Self-pay | Admitting: Internal Medicine

## 2021-01-19 DIAGNOSIS — I10 Essential (primary) hypertension: Secondary | ICD-10-CM

## 2021-01-20 ENCOUNTER — Telehealth: Payer: Self-pay

## 2021-01-20 ENCOUNTER — Other Ambulatory Visit: Payer: Self-pay

## 2021-01-20 ENCOUNTER — Ambulatory Visit (INDEPENDENT_AMBULATORY_CARE_PROVIDER_SITE_OTHER): Payer: BC Managed Care – PPO | Admitting: Student

## 2021-01-20 ENCOUNTER — Encounter: Payer: Self-pay | Admitting: Student

## 2021-01-20 DIAGNOSIS — M545 Low back pain, unspecified: Secondary | ICD-10-CM | POA: Diagnosis not present

## 2021-01-20 DIAGNOSIS — E785 Hyperlipidemia, unspecified: Secondary | ICD-10-CM | POA: Diagnosis not present

## 2021-01-20 DIAGNOSIS — I10 Essential (primary) hypertension: Secondary | ICD-10-CM | POA: Diagnosis not present

## 2021-01-20 MED ORDER — CARVEDILOL 12.5 MG PO TABS: 12.5000 mg | ORAL_TABLET | Freq: Two times a day (BID) | ORAL | 5 refills | Status: DC

## 2021-01-20 MED ORDER — AMLODIPINE BESYLATE 10 MG PO TABS
10.0000 mg | ORAL_TABLET | Freq: Every day | ORAL | 5 refills | Status: DC
Start: 2021-01-20 — End: 2021-01-20

## 2021-01-20 MED ORDER — ATORVASTATIN CALCIUM 40 MG PO TABS: 40.0000 mg | ORAL_TABLET | Freq: Every day | ORAL | 11 refills | Status: DC

## 2021-01-20 MED ORDER — IBUPROFEN 600 MG PO TABS
600.0000 mg | ORAL_TABLET | Freq: Four times a day (QID) | ORAL | 0 refills | Status: DC | PRN
Start: 2021-01-20 — End: 2021-12-29

## 2021-01-20 NOTE — Patient Instructions (Addendum)
Mrs. Becky Gross it was a pleasure meeting you today. Regarding your back pain it is likely related to a muscle strain and it may take weeks to months before this completely improves.   For your acute pain, please take medicines like ibuprofen with food. Continue to use heating pad. And avoid activities that worsen the pain. We will also print out a new work note for you.

## 2021-01-20 NOTE — Telephone Encounter (Signed)
WALK IN:  Patient presents to front desk requesting an appt for  c/o bilateral hip pain radiating to legs.  Yellow team full, opening at 1000 w/ Dr. Montez Morita and pt states she will wait.  Appt made. Forwarding to MD. Penne Lash, RN,BSN

## 2021-01-20 NOTE — Telephone Encounter (Signed)
Pt seen today in and states she went to the pharmacy and was unable to pick up the following medications:  ibuprofen ibuprofen (ADVIL) 600 MG tablet  Take 1 tablet (600 mg total) by mouth every 6 (six) hours as needed., Starting Fri 11/12/2020, Normal  Dispense: 30 tablet  Refills: 0 ordered  Pharmacy: [id number] - [place] ([place]), [place] - [address] (Ph: [phone]      amLODipine (NORVASC) 10 MG tablet

## 2021-01-21 NOTE — Progress Notes (Signed)
   CC: persistent back pain  HPI:  [name] is a 65 y.o. F with PMH per below.  Please see Encounters Tab for problem based charting and further details.   Past Medical History:  Diagnosis Date   Hypertension    Review of Systems:  See Encounters Tab for problem based charting.  Physical Exam:  Vitals:   01/20/21 0840  BP: (!) 138/57  Pulse: 68  Temp: 98.3 F (36.8 C)  TempSrc: Oral  SpO2: 100%  Weight: 126 lb 1.6 oz (57.2 kg)  Height: 5' (1.524 m)   Gen: No acute distress CV: RRR, no murmurs, rubs, or gallops Pulm: Non labored breathing on RA, no wheezing or rales Abd: Soft, NT, ND Ext: No edema of the BLE Neuro: Antalgic gait, 5/5 strength of BLE, sensation intact bilaterally. +straight leg raise test of RLE, no paraspinal TTP, no midline TTP of lumbar spine or sacrum, R buttocks TTP.   Assessment & Plan:   See Encounters Tab for problem based charting.  Patient discussed with Dr. Antony Contras

## 2021-01-22 MED ORDER — AMLODIPINE BESYLATE 10 MG PO TABS: 10.0000 mg | ORAL_TABLET | Freq: Every day | ORAL | 0 refills | Status: DC

## 2021-01-22 NOTE — Assessment & Plan Note (Signed)
Patient agreed to resume atorvastatin as she requested a refill. Prescription for atorvastatin was reordered.

## 2021-01-22 NOTE — Assessment & Plan Note (Addendum)
Stable. Denies any headaches, lightheadedness, or dizziness. Plan to continue current antihypertensives. Her medications were refilled this visit.

## 2021-01-22 NOTE — Assessment & Plan Note (Addendum)
Patient was recently seen in our clinic 7/27 for acute low back pain after lifting heavy boxes at our job. At that time she described experiencing a pulling sensation that was relieved temporarily with tylenol and advil. It was thought that her injury was 2/2 a muscle spasm and patient was sent home with flexeril, instructions to use a heating pad and exercises for her pain.   She says that since that time her back pain significantly improved with 5 days of rest and the flexeril. However, after five days of being off from work she went back and started performing the same activities (lifting boxes and bending) that precipitated her initial injury. She states the morning after her injury, she had difficulty ambulating due to the pain and she felt the pain was in a band like distribution of her lower back along with pain in her R buttocks. She denies any pain radiating down her lower extremities. She also denies decreased sensation in her groin region, weakness, or bowel or bladder incontinence.   She feels if she can avoid lifting heavy boxes and bending, she would avoid re-injuring herself.    Patient's TTP in the R gluteal region along with her pain in the gluteal region with ambulation and some concomitant pain in the lumbar region makes a muscle strain or spasm the most likely etiology of her symptoms. Patient also denies radicular symptoms making nerve root irritation less likely. Finally she has no weakness, sensory deficits, or bowel/bladder incontinence making spinal cord compression less likely.   Plan: Continue NSAIDs PRN for pain. Heating pads. And Flexeril as needed. Patient will also continue exercises/stretches for her lower back. Patient was also given a work note stating that she should avoid lifting heavy objects and bending down to prevent re-injury. She will follow up as needed for this issue.

## 2021-01-25 NOTE — Progress Notes (Signed)
Internal Medicine Clinic Attending  Case discussed with Dr. Carter  At the time of the visit.  We reviewed the resident's history and exam and pertinent patient test results.  I agree with the assessment, diagnosis, and plan of care documented in the resident's note.  

## 2021-01-25 NOTE — Addendum Note (Signed)
Addended by: Burnell Blanks on: 01/25/2021 12:35 PM   Modules accepted: Level of Service

## 2021-02-08 ENCOUNTER — Telehealth: Payer: Self-pay | Admitting: Student

## 2021-02-08 NOTE — Telephone Encounter (Signed)
Refill Request    amLODipine (NORVASC) 10 MG tablet atorvastatin (LIPITOR) 40 MG tablet carvedilol (COREG) 12.5 MG tablet   [id number] - [place] ([place]), [place] - [address] (Ph: [phone])

## 2021-02-08 NOTE — Telephone Encounter (Signed)
90 day RX was sent for Amlodipine on 8/6, other 2 RX's have refills available.  TC to patient and she was informed of above.  She states she is going to the [place] in the beginning of September and she tried to get her RX's early and could not get them.  RN informed patient her insurance probably would not pay for them because it was too early for refill and she needs to call her insurance company and explain to them she will be leaving the country and needs an early refill.  She states she tried calling and "the insurance company would not answer".  RN instructed patient to call them again, phone number should be listed on insurance card.  RN also suggested she call the pharmacy and speak to the pharmacist to see if pharmacist could assist or inform her of the earliest date she could get her medication filled.  Pt states she is frustrated and 'wants Dr to call in more medicine".  RN explained again, RX refills are available and this is an issue between pt and her insurance company, she verbalized understanding. SChaplin, RN,BSN

## 2021-04-18 ENCOUNTER — Other Ambulatory Visit: Payer: Self-pay | Admitting: Student

## 2021-04-18 DIAGNOSIS — I10 Essential (primary) hypertension: Secondary | ICD-10-CM

## 2021-04-18 MED ORDER — AMLODIPINE BESYLATE 10 MG PO TABS: 10.0000 mg | ORAL_TABLET | Freq: Every day | ORAL | 0 refills | Status: DC

## 2021-04-18 NOTE — Telephone Encounter (Signed)
Refill Request   amLODipine (NORVASC) 10 MG tablet  carvedilol (COREG) 12.5 MG tablet   [id number] - [place] ([place]), [place] - [address]

## 2021-04-20 ENCOUNTER — Telehealth: Payer: Self-pay

## 2021-04-20 NOTE — Telephone Encounter (Signed)
Pt called requesting a medication refill of her AMLODIPINE  .Marland Kitchen she stated that the pharmacy told her she has no refills .. but once I checked the chart her medication was sent over on 10/31... I called the pharmacy to ask if there was a problem with the script why pt cant get her medication that was sent over.. The tech informed me that pt picked up 90 days supply on 8/29 .Marland Kitchen I then tried to explain to pt that she got a 3 months supply and should call when the bottle is empty  at the end of this month .. pt then stated that he pills are completely  empty .... [facility] ) was notified

## 2021-04-20 NOTE — Telephone Encounter (Signed)
[facility] stated Amlodipine was refilled on 02/14/21; next refill 05/17/21. Pt was called and informed.

## 2021-05-16 ENCOUNTER — Other Ambulatory Visit: Payer: Self-pay

## 2021-05-16 ENCOUNTER — Ambulatory Visit (HOSPITAL_COMMUNITY)
Admission: EM | Admit: 2021-05-16 | Discharge: 2021-05-16 | Disposition: A | Discharge status: Home / Self Care | Payer: BC Managed Care – PPO | Attending: Emergency Medicine | Admitting: Emergency Medicine

## 2021-05-16 ENCOUNTER — Ambulatory Visit (INDEPENDENT_AMBULATORY_CARE_PROVIDER_SITE_OTHER): Payer: BC Managed Care – PPO

## 2021-05-16 ENCOUNTER — Encounter (HOSPITAL_COMMUNITY): Payer: Self-pay

## 2021-05-16 DIAGNOSIS — M79672 Pain in left foot: Secondary | ICD-10-CM | POA: Diagnosis not present

## 2021-05-16 DIAGNOSIS — S9032XA Contusion of left foot, initial encounter: Secondary | ICD-10-CM

## 2021-05-16 DIAGNOSIS — M79662 Pain in left lower leg: Secondary | ICD-10-CM

## 2021-05-16 MED ORDER — PREDNISONE 10 MG PO TABS: 20.0000 mg | ORAL_TABLET | Freq: Every day | ORAL | 0 refills | Status: DC

## 2021-05-16 NOTE — ED Provider Notes (Signed)
[facility]    CSN: [id number] Arrival date & time: 05/16/21  1761      History   Chief Complaint Chief Complaint  Patient presents with   Foot Pain    HPI Becky Gross is a 65 y.o. female.   Hit lt foot/ shin area on tv and has pain since sept. Has applied warm compress with no relief. Took motrin with slight relief. Pt does not want to take medications due to her culture and will only take if she needs it. Able to walk but when she is at work she feels that it causes the pain to return.    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 No pertinent surgical history.  OB History   No obstetric history on file.      Home Medications    Prior to Admission medications   Medication Sig Start Date End Date Taking? Authorizing Provider  acetaminophen (TYLENOL) 325 MG tablet Take 2 tablets (650 mg total) by mouth every 6 (six) hours as needed for up to 30 doses for mild pain or moderate pain. 07/02/20   Terald Sleeper, MD  amLODipine (NORVASC) 10 MG tablet Take 1 tablet (10 mg total) by mouth daily. 04/18/21   Merrilyn Puma, MD  atorvastatin (LIPITOR) 40 MG tablet Take 1 tablet (40 mg total) by mouth daily. 01/20/21   Marolyn Haller, MD  carvedilol (COREG) 12.5 MG tablet Take 1 tablet (12.5 mg total) by mouth 2 (two) times daily with a meal. 01/20/21 07/19/21  Marolyn Haller, MD  cyclobenzaprine (FLEXERIL) 10 MG tablet Take 1 tablet (10 mg total) by mouth 3 (three) times daily as needed for muscle spasms. 01/12/21   Dellis Filbert, MD  ibuprofen (ADVIL) 600 MG tablet Take 1 tablet (600 mg total) by mouth every 6 (six) hours as needed. 01/20/21   Marolyn Haller, MD  polyethylene glycol (MIRALAX) 17 g packet Take 17 g by mouth daily. 05/27/20   Seawell, Shanon Brow, DO    Family History Family History  Family history unknown: Yes    Social History Social History   Tobacco Use   Smoking status: Never   Smokeless tobacco: Never  Vaping Use   Vaping Use: Never used  Substance Use Topics   Alcohol use: Not Currently   Drug use: Not Currently     Allergies   Patient has no known allergies.   Review of Systems Review of Systems  Constitutional: Negative.   Respiratory: Negative.    Cardiovascular: Negative.   Gastrointestinal: Negative.   Genitourinary: Negative.   Musculoskeletal:  Negative for joint swelling.       Pain to lt foot shin area from injury in sept   Skin: Negative.   Neurological: Negative.     Physical Exam Triage Vital Signs ED Triage Vitals  Enc Vitals Group     BP 05/16/21 0829 (!) 172/76     Pulse Rate 05/16/21 0829 65     Resp 05/16/21 0829 18     Temp 05/16/21 0829 98.1 F (36.7 C)     Temp Source 05/16/21 0829 Oral     SpO2 05/16/21 0829 98 %     Weight --  Height --      Head Circumference --      Peak Flow --      Pain Score 05/16/21 0828 4     Pain Loc --      Pain Edu? --      Excl. in GC? --    No data found.  Updated Vital Signs BP (!) 172/76 (BP Location: Right Arm)   Pulse 65   Temp 98.1 F (36.7 C) (Oral)   Resp 18   SpO2 98%   Visual Acuity Right Eye Distance:   Left Eye Distance:   Bilateral Distance:    Right Eye Near:   Left Eye Near:    Bilateral Near:     Physical Exam Constitutional:      Appearance: Normal appearance.  Cardiovascular:     Rate and Rhythm: Normal rate.  Pulmonary:     Effort: Pulmonary effort is normal.  Abdominal:     General: Abdomen is flat.  Musculoskeletal:        General: Tenderness and signs of injury present. No swelling or deformity.     Right lower leg: No edema.     Left lower leg: No edema.     Comments: Lt lower  shin area near foot ,   Skin:    General: Skin is warm.     Findings: No bruising, erythema or rash.  Neurological:     General: No focal deficit present.     Mental Status: She is alert.     UC Treatments / Results  Labs (all labs ordered are listed, but only abnormal results are displayed) Labs Reviewed - No data to display  EKG   Radiology DG Foot 2 Views Left  Result Date: 05/16/2021 CLINICAL DATA:  Left foot pain after injury several months ago. EXAM: LEFT FOOT - 2 VIEW COMPARISON:  None. FINDINGS: There is no evidence of fracture or dislocation. Mild hallux valgus deformity seen about the first metatarsophalangeal joint with mild degenerative joint disease involving the first metatarsophalangeal joint. Mild posterior calcaneal spurring is noted. Soft tissues are unremarkable. IMPRESSION: Mild hallux valgus deformity involving first metatarsophalangeal joint. No acute abnormality seen. Electronically Signed   By: Lupita Raider M.D.   On: 05/16/2021 08:43    Procedures Procedures (including critical care time)  Medications Ordered in UC Medications - No data to display  Initial Impression / Assessment and Plan / UC Course  I have reviewed the triage vital signs and the nursing notes.  Pertinent labs & imaging results that were available during my care of the patient were reviewed by me and considered in my medical decision making (see chart for details).     Take tylenol and steroids as needed for pain Follow up with ortho to rule out other causes of the pain  Cont to use warm compresses   Final Clinical Impressions(s) / UC Diagnoses   Final diagnoses:  None   Discharge Instructions   None    ED Prescriptions   None    PDMP not reviewed this encounter.   Coralyn Mark, NP 05/16/21 [phone]

## 2021-05-16 NOTE — Discharge Instructions (Addendum)
Take tylenol and steroids as needed for pain Follow up with ortho to rule out other causes of the pain  Cont to use warm compresses

## 2021-05-16 NOTE — ED Triage Notes (Signed)
Pt presents with c/o L foot pain since September. States she hit her L foot on a tv. States she applied hot compress to the painful area.

## 2021-05-23 ENCOUNTER — Telehealth: Payer: Self-pay | Admitting: Student

## 2021-05-23 DIAGNOSIS — I10 Essential (primary) hypertension: Secondary | ICD-10-CM

## 2021-05-23 NOTE — Telephone Encounter (Signed)
Patient should have refills on both meds at [facility] ([place]). Call placed to patient. States she works at [employer] and they told her they don't have refills. Spoke with Melody at [employer]. States patient p/u 90 day supply of amlodipine on 11/5 and carvedilol refill is ready for p/u. 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

## 2021-05-23 NOTE — Telephone Encounter (Signed)
Refill Request   amLODipine (NORVASC) 10 MG tablet  carvedilol (COREG) 12.5 MG tablet  [id number] - [place] ([place]), [place] - [address] (Ph: [phone])

## 2021-06-06 ENCOUNTER — Ambulatory Visit (HOSPITAL_COMMUNITY)
Admission: EM | Admit: 2021-06-06 | Discharge: 2021-06-06 | Disposition: A | Discharge status: Home / Self Care | Payer: BC Managed Care – PPO | Attending: Internal Medicine | Admitting: Internal Medicine

## 2021-06-06 ENCOUNTER — Encounter (HOSPITAL_COMMUNITY): Payer: Self-pay

## 2021-06-06 DIAGNOSIS — M5489 Other dorsalgia: Secondary | ICD-10-CM | POA: Diagnosis not present

## 2021-06-06 MED ORDER — CYCLOBENZAPRINE HCL 10 MG PO TABS: 10.0000 mg | ORAL_TABLET | Freq: Every evening | ORAL | 0 refills | Status: DC | PRN

## 2021-06-06 NOTE — ED Triage Notes (Signed)
Pt presents to the office for back pain x 1 year. She reports having to lift heavy equipment at work.

## 2021-06-06 NOTE — ED Provider Notes (Signed)
[facility]    CSN: [id number] Arrival date & time: 06/06/21  0805      History   Chief Complaint Chief Complaint  Patient presents with   Back Pain    HPI Becky Gross is a 65 y.o. female comes to the urgent care with back pain which started a couple of days ago.  Patient has recurrent back pain.  She works at [employer] and her job involves lifting heavy objects.  She describes the pain as sharp and throbbing mainly in the lower back area.  Pain is aggravated by heavy lifting.  No known relieving factors.  Ibuprofen gives her partial relief.  No numbness or tingling or weakness in the lower extremities.  No bladder or bowel problems.  Patient is requesting a note to change her assigned duties at work.Marland Kitchen   HPI  Past Medical History:  Diagnosis Date   Hypertension     Patient Active Problem List   Diagnosis Date Noted   Low back pain with muscle spasm 01/12/2021   Hyperlipidemia 05/27/2020   Prediabetes 05/27/2020   TMJ (temporomandibular joint disorder) 05/27/2020   Hypertension 05/12/2020   Healthcare maintenance 05/12/2020    History reviewed. No pertinent surgical history.  OB History   No obstetric history on file.      Home Medications    Prior to Admission medications   Medication Sig Start Date End Date Taking? Authorizing Provider  acetaminophen (TYLENOL) 325 MG tablet Take 2 tablets (650 mg total) by mouth every 6 (six) hours as needed for up to 30 doses for mild pain or moderate pain. 07/02/20   Terald Sleeper, MD  amLODipine (NORVASC) 10 MG tablet Take 1 tablet (10 mg total) by mouth daily. 04/18/21   Merrilyn Puma, MD  atorvastatin (LIPITOR) 40 MG tablet Take 1 tablet (40 mg total) by mouth daily. 01/20/21   Marolyn Haller, MD  carvedilol (COREG) 12.5 MG tablet Take 1 tablet (12.5 mg total) by mouth 2 (two) times daily with a meal. 01/20/21 07/19/21  Marolyn Haller, MD  cyclobenzaprine (FLEXERIL) 10 MG tablet Take 1 tablet (10 mg total)  by mouth at bedtime as needed for muscle spasms. 06/06/21   Merrilee Jansky, MD  ibuprofen (ADVIL) 600 MG tablet Take 1 tablet (600 mg total) by mouth every 6 (six) hours as needed. 01/20/21   Marolyn Haller, MD  polyethylene glycol (MIRALAX) 17 g packet Take 17 g by mouth daily. 05/27/20   Seawell, Jaimie A, DO  predniSONE (DELTASONE) 10 MG tablet Take 2 tablets (20 mg total) by mouth daily. 05/16/21   Coralyn Mark, NP    Family History Family History  Family history unknown: Yes    Social History Social History   Tobacco Use   Smoking status: Never   Smokeless tobacco: Never  Vaping Use   Vaping Use: Never used  Substance Use Topics   Alcohol use: Not Currently   Drug use: Not Currently     Allergies   Patient has no known allergies.   Review of Systems Review of Systems  Cardiovascular: Negative.   Gastrointestinal: Negative.   Genitourinary: Negative.   Musculoskeletal:  Positive for back pain. Negative for arthralgias and neck stiffness.    Physical Exam Triage Vital Signs ED Triage Vitals [06/06/21 0839]  Enc Vitals Group     BP (!) 156/71     Pulse Rate 78     Resp 18     Temp 97.8 F (36.6 C)  Temp Source Oral     SpO2      Weight      Height      Head Circumference      Peak Flow      Pain Score 9     Pain Loc      Pain Edu?      Excl. in GC?    No data found.  Updated Vital Signs BP (!) 156/71 (BP Location: Left Arm)    Pulse 78    Temp 97.8 F (36.6 C) (Oral)    Resp 18   Visual Acuity Right Eye Distance:   Left Eye Distance:   Bilateral Distance:    Right Eye Near:   Left Eye Near:    Bilateral Near:     Physical Exam Vitals and nursing note reviewed.  Constitutional:      General: She is not in acute distress.    Appearance: She is not ill-appearing.  Cardiovascular:     Rate and Rhythm: Normal rate and regular rhythm.     Pulses: Normal pulses.     Heart sounds: Normal heart sounds.  Pulmonary:     Effort:  Pulmonary effort is normal.     Breath sounds: Normal breath sounds.  Musculoskeletal:     Comments: No paraspinal muscle tenderness.  Full range of motion of the lumbar spine.  Straight leg raise test is negative.  Deep tendon reflexes in the patella is 2+.  Neurological:     Mental Status: She is alert.     UC Treatments / Results  Labs (all labs ordered are listed, but only abnormal results are displayed) Labs Reviewed - No data to display  EKG   Radiology No results found.  Procedures Procedures (including critical care time)  Medications Ordered in UC Medications - No data to display  Initial Impression / Assessment and Plan / UC Course  I have reviewed the triage vital signs and the nursing notes.  Pertinent labs & imaging results that were available during my care of the patient were reviewed by me and considered in my medical decision making (see chart for details).     1.  Back pain without sciatica: Gentle range of motion exercises Heating pad use Back strengthening exercises Continue ibuprofen as needed for pain Sparingly use Flexeril at bedtime if muscle spasms or stiffness is intolerable. Follow-up with occupational health. Final Clinical Impressions(s) / UC Diagnoses   Final diagnoses:  Back pain without sciatica     Discharge Instructions      Please take ibuprofen as prescribed Take muscle relaxers only at bedtime Gentle range of motion exercises Heating pad use only 20 minutes on-20 minutes off cycle Back strengthening exercises recommended Follow-up with occupational health if symptoms persist or worsens.   ED Prescriptions     Medication Sig Dispense Auth. Provider   cyclobenzaprine (FLEXERIL) 10 MG tablet Take 1 tablet (10 mg total) by mouth at bedtime as needed for muscle spasms. 20 tablet Flordia Kassem, Britta Mccreedy, MD      PDMP not reviewed this encounter.   Merrilee Jansky, MD 06/06/21 [phone]

## 2021-06-06 NOTE — Discharge Instructions (Addendum)
Please take ibuprofen as prescribed Take muscle relaxers only at bedtime Gentle range of motion exercises Heating pad use only 20 minutes on-20 minutes off cycle Back strengthening exercises recommended Follow-up with occupational health if symptoms persist or worsens.

## 2021-08-22 ENCOUNTER — Other Ambulatory Visit: Payer: Self-pay | Admitting: Student

## 2021-08-22 DIAGNOSIS — I10 Essential (primary) hypertension: Secondary | ICD-10-CM

## 2021-09-09 ENCOUNTER — Telehealth: Payer: Self-pay

## 2021-09-09 NOTE — Telephone Encounter (Signed)
Patient contacted to schedule mammogram.  ? ?RE: Mobile Mammo event located at: ? ?[email or web address]  [facility]  ?      ?[address]    ?[place] [address]    ? ?Date: April 7th  ? ?Patient stated that she was told that she would only need a mammogram q5y.  ? ?Patient was not able to tell me where she had the last one completed.  ? ?

## 2021-09-16 ENCOUNTER — Other Ambulatory Visit: Payer: Self-pay | Admitting: *Deleted

## 2021-09-16 DIAGNOSIS — I10 Essential (primary) hypertension: Secondary | ICD-10-CM

## 2021-09-16 NOTE — Telephone Encounter (Signed)
Patient called back.  ? ?RE: Mobile Mammo event located at: ? ?[place]  [facility]  ?      ?[address]    ?[place] [address]    ? ?Date: April 7th at 03:20 pm  ? ? ?

## 2021-09-19 ENCOUNTER — Other Ambulatory Visit: Payer: Self-pay | Admitting: Student

## 2021-09-19 DIAGNOSIS — Z139 Encounter for screening, unspecified: Secondary | ICD-10-CM

## 2021-09-19 MED ORDER — AMLODIPINE BESYLATE 10 MG PO TABS: 10.0000 mg | ORAL_TABLET | Freq: Every day | ORAL | 0 refills | Status: DC

## 2021-09-19 MED ORDER — CARVEDILOL 12.5 MG PO TABS: 12.5000 mg | ORAL_TABLET | Freq: Two times a day (BID) | ORAL | 0 refills | Status: DC

## 2021-09-22 ENCOUNTER — Encounter: Payer: Self-pay | Admitting: Internal Medicine

## 2021-09-22 ENCOUNTER — Ambulatory Visit: Payer: BC Managed Care – PPO | Admitting: Internal Medicine

## 2021-09-22 ENCOUNTER — Other Ambulatory Visit: Payer: Self-pay

## 2021-09-22 VITALS — BP 101/52 | HR 65 | Temp 98.4°F | Resp 20 | Ht 60.0 in | Wt 133.7 lb

## 2021-09-22 DIAGNOSIS — R739 Hyperglycemia, unspecified: Secondary | ICD-10-CM

## 2021-09-22 DIAGNOSIS — R7303 Prediabetes: Secondary | ICD-10-CM

## 2021-09-22 DIAGNOSIS — E785 Hyperlipidemia, unspecified: Secondary | ICD-10-CM

## 2021-09-22 DIAGNOSIS — M545 Low back pain, unspecified: Secondary | ICD-10-CM

## 2021-09-22 DIAGNOSIS — I10 Essential (primary) hypertension: Secondary | ICD-10-CM

## 2021-09-22 LAB — POCT GLYCOSYLATED HEMOGLOBIN (HGB A1C): Hemoglobin A1C: 6.4 % — AB (ref 4.0–5.6)

## 2021-09-22 LAB — GLUCOSE, CAPILLARY: Glucose-Capillary: 128 mg/dL — ABNORMAL HIGH (ref 70–99)

## 2021-09-22 MED ORDER — AMLODIPINE BESYLATE 10 MG PO TABS: 10.0000 mg | ORAL_TABLET | Freq: Every day | ORAL | 0 refills | Status: DC

## 2021-09-22 MED ORDER — ATORVASTATIN CALCIUM 40 MG PO TABS
40.0000 mg | ORAL_TABLET | Freq: Every day | ORAL | 11 refills | Status: DC
Start: 2021-09-22 — End: 2022-01-02

## 2021-09-22 NOTE — Assessment & Plan Note (Signed)
Patient still suffering from back pain exacerbated by heaving lifting at work. It gets better with rest but will flare up again with lifting at work. ? ?A/P ?- continue ibuprofen and heating pads ?- work note provided to limit exacerbating activities ?

## 2021-09-22 NOTE — Patient Instructions (Signed)
Becky Gross ? ?It was a pleasure seeing you in the clinic today.  ? ?We talked about your blood sugar, your blood pressure, and your back pain. I have a doctor's note here for your work. ? ?Blood pressure- stop taking coreg. Continue taking amlodipine ? ?Please call our clinic at [phone] if you have any questions or concerns. The best time to call is Monday-Friday from 9am-4pm, but there is someone available 24/7 at the same number. If you need medication refills, please notify your pharmacy one week in advance and they will send [place] a request. ?  ?Thank you for letting us take part in your care. We look forward to seeing you next time! ? ?

## 2021-09-22 NOTE — Assessment & Plan Note (Signed)
BP lower than it needs to be today at 101/52. Will back off on medications and stop coreg ?- continue amlodipine 10 ?

## 2021-09-22 NOTE — Assessment & Plan Note (Addendum)
Checked A1c today, still elevated at 6.4. Discussed with patient that she is near the diabetic range however she feels that she can work on this with lifestyle management and does not need medications or a referral to nutrition services at this time. ?- continue lifestyle management ?

## 2021-09-22 NOTE — Progress Notes (Signed)
? ?  CC: back pain ? ?HPI: ? ?[name] is a 66 y.o. PMH noted below, who presents to the [facility] with complaints of back pain. To see the management of his acute and chronic conditions, please refer to the A&P note under the encounters tab.  ? ?Past Medical History:  ?Diagnosis Date  ? Hypertension   ? ?Review of Systems:  positive for back pain ? ?Physical Exam: ?Gen: elderly woman in NAD ?HEENT: normocephalic atraumatic, MMM ?CV: RRR, no m/r/g   ?Resp: CTAB, normal WOB  ?GI: soft, nontender ?MSK: moves all extremities without difficulty ?Skin:warm and dry ?Neuro:alert answering questions appropriately ?Psych: normal affect ? ? ?Assessment & Plan:  ? ?See Encounters Tab for problem based charting. ? ?Patient discussed with Dr. Philipp Gross  ? ?

## 2021-09-22 NOTE — Assessment & Plan Note (Signed)
Continue atorvastatin

## 2021-09-23 ENCOUNTER — Ambulatory Visit
Admission: RE | Admit: 2021-09-23 | Discharge: 2021-09-23 | Disposition: A | Discharge status: Home / Self Care | Payer: BC Managed Care – PPO | Source: Ambulatory Visit | Attending: *Deleted | Admitting: *Deleted

## 2021-09-23 DIAGNOSIS — Z139 Encounter for screening, unspecified: Secondary | ICD-10-CM

## 2021-10-03 NOTE — Progress Notes (Signed)
Internal Medicine Clinic Attending ° °Case discussed with Dr. DeMaio  At the time of the visit.  We reviewed the resident’s history and exam and pertinent patient test results.  I agree with the assessment, diagnosis, and plan of care documented in the resident’s note. ° ° °

## 2021-10-10 ENCOUNTER — Ambulatory Visit (INDEPENDENT_AMBULATORY_CARE_PROVIDER_SITE_OTHER): Payer: BC Managed Care – PPO | Admitting: Internal Medicine

## 2021-10-10 VITALS — BP 136/63 | HR 68 | Temp 98.2°F | Ht 60.0 in | Wt 131.8 lb

## 2021-10-10 DIAGNOSIS — M545 Low back pain, unspecified: Secondary | ICD-10-CM | POA: Diagnosis not present

## 2021-10-10 DIAGNOSIS — M25562 Pain in left knee: Secondary | ICD-10-CM | POA: Diagnosis not present

## 2021-10-10 DIAGNOSIS — G8929 Other chronic pain: Secondary | ICD-10-CM | POA: Diagnosis not present

## 2021-10-10 MED ORDER — NAPROXEN 500 MG PO TABS: 500.0000 mg | ORAL_TABLET | Freq: Two times a day (BID) | ORAL | 0 refills | Status: AC

## 2021-10-10 MED ORDER — DICLOFENAC SODIUM 1 % EX GEL: 4.0000 g | Freq: Four times a day (QID) | CUTANEOUS | 1 refills | Status: DC

## 2021-10-10 NOTE — Patient Instructions (Signed)
Thank you for trusting me with your care. To recap, today we discussed the following: ? ? ?Chronic bilateral low back and left knee pain ?- naproxen (NAPROSYN) 500 MG tablet; Take 1 tablet (500 mg total) by mouth 2 (two) times daily with a meal for 10 days.  Dispense: 20 tablet; Refill: 0 ?- naproxen (NAPROSYN) 500 MG tablet; Take 1 tablet (500 mg total) by mouth 2 (two) times daily with a meal for 10 days.  Dispense: 20 tablet; Refill: 0 ?- diclofenac Sodium (VOLTAREN) 1 % GEL; Apply 4 g topically 4 (four) times daily.  Dispense: 150 g; Refill: 1 ?- if not improving with treatment ,make appointment for evaluation and imaging ?

## 2021-10-11 ENCOUNTER — Encounter: Payer: Self-pay | Admitting: Internal Medicine

## 2021-10-11 DIAGNOSIS — M25562 Pain in left knee: Secondary | ICD-10-CM | POA: Insufficient documentation

## 2021-10-11 NOTE — Progress Notes (Signed)
? ?  CC: low back pain ? ?HPI:[name] is a 66 y.o. female who presents for evaluation of low back pain. Please see individual problem based A/P for details. ? ? ?Past Medical History:  ?Diagnosis Date  ? Hypertension   ? ?Review of Systems:   ?Review of Systems  ?Constitutional:  Negative for chills and fever.  ?Musculoskeletal:  Positive for back pain. Negative for falls and joint pain.  ?Neurological:  Negative for tingling, focal weakness and weakness.   ? ?Physical Exam: ?Vitals:  ? 10/10/21 0935 10/10/21 0942  ?BP: 136/63 136/63  ?Pulse: 68   ?Temp: 98.2 ?F (36.8 ?C)   ?TempSrc: Oral   ?SpO2: 100%   ?Weight: 131 lb 12.8 oz (59.8 kg)   ?Height: 5' (1.524 m)   ? ? ?Back: ?No gross deformity, scoliosis. ?TTP in bilateral muscles in lumbosacral area.  No midline or bony TTP. ?FROM. ?Strength LEs 5/5 all muscle groups.   ?2+ MSRs in patellar and achilles tendons, equal bilaterally. ?Negative SLRs. ?Sensation intact to light touch bilaterally. ? ? ?Knee: ?- Inspection: No gross deformity. No effusion. No erythema or bruising. Skin intact ?- Palpation: no TTP ?- ROM: full active ROM with flexion and extension in knee ?- Strength: 5/5 strength ?- Neuro/vasc: NV intact ?- Special Tests: ?- LIGAMENTS: negative anterior/posterior drawer, negative Lachman's, no MCL or LCL laxity  ?-- MENISCUS: negative McMurray's, negative Thessaly  ?-- PF JOINT: nml patellar mobility without apprehension. Negative patellar grind ? ?Hips: normal, pain free passive ROM with IR/ER ? ? ?Assessment & Plan:  ? ?Low back pain with muscle spasm ?Patient here for low back pain.  Review of previous notes exacerbated by heavy lifting at work.  Patient is requesting accommodations. 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 In addition having some left knee pain.  Pain is improved with ibuprofen and heating pads recommended, , but returns with work.  She has not  had any imaging and would not like any completed. I discussed disability paper work would need to be done with a disability doctor. I am not sure accomodation would resolve back pain. The heaviest object is 50 pounds.  She is going to request FMLA from her HR department. I also believe she would benefit from a different type of work. No red flag symptoms. For acute pain will prescribe course of NSAIDS. Recommended follow up if not resolving.  ? ?Left knee pain ?Patient is experiencing left knee pain. Pain comes and goes, but bothering her more the last 2 weeks. No falls or trauma to the knee. No history of gout or autoimmune disease.  ? ?Assessment/plan: osteoarthritis of left knee.  Prescribed course of naproxen patient continues as needed.  Voltaren gel after completing PO NSAID ?- naproxen (NAPROSYN) 500 MG tablet; Take 1 tablet (500 mg total) by mouth 2 (two) times daily with a meal for 10 days.  Dispense: 20 tablet; Refill: 0 ?- diclofenac Sodium (VOLTAREN) 1 % GEL; Apply 4 g topically 4 (four) times daily.  Dispense: 150 g; Refill: 1 ? ? ? ?Patient discussed with Dr.  Dareen Piano ? ?

## 2021-10-11 NOTE — Assessment & Plan Note (Addendum)
Patient here for low back pain.  Review of previous notes exacerbated by heavy lifting at work.  Patient is requesting accommodations. 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 In addition having some left knee pain.  Pain is improved with ibuprofen and heating pads recommended, , but returns with work.  She has not had any imaging and would not like any completed. I discussed disability paper work would need to be done with a disability doctor. I am not sure accomodation would resolve back pain. The heaviest object is 50 pounds.  She is going to request FMLA from her HR department. I also believe she would benefit from a different type of work. No red flag symptoms. For acute pain will prescribe course of NSAIDS. Recommended follow up if not resolving.  ?

## 2021-10-11 NOTE — Assessment & Plan Note (Signed)
Patient is experiencing left knee pain. Pain comes and goes, but bothering her more the last 2 weeks. No falls or trauma to the knee. No history of gout or autoimmune disease.  ? ?Assessment/plan: osteoarthritis of left knee.  Prescribed course of naproxen patient continues as needed.  Voltaren gel after completing PO NSAID ?- naproxen (NAPROSYN) 500 MG tablet; Take 1 tablet (500 mg total) by mouth 2 (two) times daily with a meal for 10 days.  Dispense: 20 tablet; Refill: 0 ?- diclofenac Sodium (VOLTAREN) 1 % GEL; Apply 4 g topically 4 (four) times daily.  Dispense: 150 g; Refill: 1 ? ?

## 2021-10-12 NOTE — Addendum Note (Signed)
Addended by: Earl Lagos on: 10/12/2021 02:09 PM ? ? Modules accepted: Level of Service ? ?

## 2021-10-12 NOTE — Progress Notes (Signed)
Internal Medicine Clinic Attending  Case discussed with Dr. Steen  At the time of the visit.  We reviewed the resident's history and exam and pertinent patient test results.  I agree with the assessment, diagnosis, and plan of care documented in the resident's note.  

## 2021-12-22 ENCOUNTER — Encounter: Payer: Self-pay | Admitting: Student

## 2021-12-22 ENCOUNTER — Other Ambulatory Visit: Payer: Self-pay

## 2021-12-22 ENCOUNTER — Ambulatory Visit (INDEPENDENT_AMBULATORY_CARE_PROVIDER_SITE_OTHER): Payer: Commercial Managed Care - HMO | Admitting: Student

## 2021-12-22 VITALS — BP 130/64 | HR 72 | Temp 98.2°F | Ht 61.0 in | Wt 137.9 lb

## 2021-12-22 DIAGNOSIS — I10 Essential (primary) hypertension: Secondary | ICD-10-CM

## 2021-12-22 DIAGNOSIS — M25562 Pain in left knee: Secondary | ICD-10-CM

## 2021-12-22 MED ORDER — NAPROXEN 500 MG PO TABS: 500.0000 mg | ORAL_TABLET | Freq: Two times a day (BID) | ORAL | 2 refills | Status: DC

## 2021-12-22 NOTE — Assessment & Plan Note (Signed)
Blood pressure today is 130/64.  Blood pressure seems well controlled we will continue current regimen.  Plan: 1.  Continue amlodipine 10 mg daily.

## 2021-12-22 NOTE — Progress Notes (Signed)
CC: Left knee pain  HPI:  [name] is a 66 y.o. female living with a history stated below and presents today for left knee/leg pain. Please see problem based assessment and plan for additional details.  Past Medical History:  Diagnosis Date   Hypertension     Current Outpatient Medications on File Prior to Visit  Medication Sig Dispense Refill   acetaminophen (TYLENOL) 325 MG tablet Take 2 tablets (650 mg total) by mouth every 6 (six) hours as needed for up to 30 doses for mild pain or moderate pain. 30 tablet 0   amLODipine (NORVASC) 10 MG tablet Take 1 tablet (10 mg total) by mouth daily. 90 tablet 0   atorvastatin (LIPITOR) 40 MG tablet Take 1 tablet (40 mg total) by mouth daily. 90 tablet 11   cyclobenzaprine (FLEXERIL) 10 MG tablet Take 1 tablet (10 mg total) by mouth at bedtime as needed for muscle spasms. 20 tablet 0   diclofenac Sodium (VOLTAREN) 1 % GEL Apply 4 g topically 4 (four) times daily. 150 g 1   ibuprofen (ADVIL) 600 MG tablet Take 1 tablet (600 mg total) by mouth every 6 (six) hours as needed. 30 tablet 0   polyethylene glycol (MIRALAX) 17 g packet Take 17 g by mouth daily. 14 each 5   predniSONE (DELTASONE) 10 MG tablet Take 2 tablets (20 mg total) by mouth daily. 15 tablet 0   No current facility-administered medications on file prior to visit.    Family History  Problem Relation Age of Onset   Breast cancer Neg Hx     Social History   Socioeconomic History   Marital status: Legally Separated    Spouse name: Not on file   Number of children: Not on file   Years of education: Not on file   Highest education level: Not on file  Occupational History   Not on file  Tobacco Use   Smoking status: Never   Smokeless tobacco: Never  Vaping Use   Vaping Use: Never used  Substance and Sexual Activity   Alcohol use: Not Currently   Drug use: Not Currently   Sexual activity: Not on file  Other Topics Concern   Not on file  Social History  Narrative   Not on file   Social Determinants of Health   Financial Resource Strain: Not on file  Food Insecurity: Not on file  Transportation Needs: Not on file  Physical Activity: Not on file  Stress: Not on file  Social Connections: Not on file  Intimate Partner Violence: Not on file    Review of Systems: ROS negative except for what is noted on the assessment and plan.  Vitals:   12/22/21 1024  BP: 130/64  Pulse: 72  Temp: 98.2 F (36.8 C)  TempSrc: Oral  SpO2: 100%  Weight: 137 lb 14.4 oz (62.6 kg)  Height: 5\' 1"  (1.549 m)    Physical Exam: Constitutional: well-appearing woman  in no acute distress Abdominal: soft, non-tender, non-distended MSK: normal bulk and tone, full range of motion in both extremities Neurological: alert & oriented x 3, 5/5 strength in bilateral upper and lower extremities, normal gait Skin: warm and dry   Assessment & Plan:   Left knee pain  Patient has a 2-year history of chronic back pain as well as this left leg pain that has been going on for the past 6 months.  Patient's previous job was working at in which she had to lift a lot of  weight throughout the day.  She says that the pain when she wakes up is around 4/10, and when she comes back from work and the pain is around a 7/10. Pt states pain gets worse throughout the day. At the moment of examination was around 2/10. Patient is currently taking naproxen 500 mg twice a week in order to help manage the pain.  The only things that help the pain go away or hot cold press as well as the naproxen. Patient reports pain is from the knee all the way down to the ankle on the left side she has full range of motion of the left leg and knee.  Plan: 1.  Start naproxen 2 times a day 500 mg for the next week. 2.  Continue using hot cold presses and ice whenever she is in pain. 3.  Could be a candidate for a knee injection if naproxen on does not help control the pain. 4.  We will schedule a  televisit to help see what the next steps are for next week.  Hypertension Blood pressure today is 130/64.  Blood pressure seems well controlled we will continue current regimen.  Plan: 1.  Continue amlodipine 10 mg daily.  Patient seen with Dr. Dewain Penning Seena Ritacco, M.D. [facility], PGY-1 Phone: [phone] Date 12/22/2021 Time 12:33 PM

## 2021-12-22 NOTE — Assessment & Plan Note (Signed)
  Patient has a 2-year history of chronic back pain as well as this left leg pain that has been going on for the past 6 months.  Patient's previous job was working at [employer] in which she had to lift a lot of weight throughout the day.  She says that the pain when she wakes up is around 4/10, and when she comes back from work and the pain is around a 7/10. Pt states pain gets worse throughout the day. At the moment of examination was around 2/10. Patient is currently taking naproxen 500 mg twice a week in order to help manage the pain.  The only things that help the pain go away or hot cold press as well as the naproxen. Patient reports pain is from the knee all the way down to the ankle on the left side she has full range of motion of the left leg and knee.  Plan: 1.  Start naproxen 2 times a day 500 mg for the next week. 2.  Continue using hot cold presses and ice whenever she is in pain. 3.  Could be a candidate for a knee injection if naproxen on does not help control the pain. 4.  We will schedule a televisit to help see what the next steps are for next week.

## 2021-12-22 NOTE — Patient Instructions (Addendum)
Thank you so much for coming into the clinic today!  We talked about a few things today!  We talked about your left leg pain, and I am giving you another round of naproxen, please take TWICE A DAY for a week, and make a televisit with [place] in a week to check in how you are doing.  2.  Please stay away from doing anything too strenuous such as lifting heavy boxes walking around for too long.  3.  Continue using heating pad you can also use ice packs as well anything that helps you alleviate the pain.    If you have any questions or concerns please don't hesitate to call the clinic at [phone]!  Dr. Jason Fila Shatima Zalar

## 2021-12-26 NOTE — Progress Notes (Signed)
Internal Medicine Clinic Attending  I saw and evaluated the patient.  I personally confirmed the key portions of the history and exam documented by Dr. Nooruddin and I reviewed pertinent patient test results.  The assessment, diagnosis, and plan were formulated together and I agree with the documentation in the resident's note.  

## 2021-12-29 ENCOUNTER — Ambulatory Visit (INDEPENDENT_AMBULATORY_CARE_PROVIDER_SITE_OTHER): Payer: Commercial Managed Care - HMO | Admitting: Student

## 2021-12-29 ENCOUNTER — Encounter: Payer: Self-pay | Admitting: Student

## 2021-12-29 VITALS — BP 146/67 | HR 60 | Temp 98.5°F | Ht 61.0 in | Wt 139.6 lb

## 2021-12-29 DIAGNOSIS — I10 Essential (primary) hypertension: Secondary | ICD-10-CM

## 2021-12-29 DIAGNOSIS — R7303 Prediabetes: Secondary | ICD-10-CM

## 2021-12-29 DIAGNOSIS — E2839 Other primary ovarian failure: Secondary | ICD-10-CM | POA: Diagnosis not present

## 2021-12-29 DIAGNOSIS — Z0000 Encounter for general adult medical examination without abnormal findings: Secondary | ICD-10-CM

## 2021-12-29 DIAGNOSIS — E782 Mixed hyperlipidemia: Secondary | ICD-10-CM

## 2021-12-29 NOTE — Patient Instructions (Signed)
Becky Gross,  It was a pleasure seeing you today.  I am glad that you are doing well.  1.  For your leg pain: Try to cut back on naproxen and take it as needed only.  Can also try light stretching exercise for your leg at the end of the day.  2.  Your blood pressure at home is very well controlled.  I will check your kidney function today  3.  I will recheck your cholesterol and A1c.  I will call you for the result and discuss if we can take you off of atorvastatin.  4.  I ordered a bone scan to rule out osteoporosis (soft bone)  Please return in 6 months, sooner if needed  Take care,  Dr. Cyndie Chime

## 2021-12-29 NOTE — Assessment & Plan Note (Signed)
-   Declined hepatitis C screening - States that she had colonoscopy done every 5 years [place]  Her last colonoscopy was in 2019 and she is due for 2024. - Report received pneumonia shot last month in [place]. - DEXA scan ordered

## 2021-12-29 NOTE — Assessment & Plan Note (Addendum)
Unfortunately we were not able to collect her A1C at this visit. Will obtain at next visit.

## 2021-12-29 NOTE — Assessment & Plan Note (Addendum)
Patient currently taking atorvastatin 40 mg.  Her LDL was 44 in May 2022.  Patient asked if she can be off of Lipitor.  She report close monitoring of her blood pressure and CBG at home.  -Recheck lipid panel and A1c today.  Depends on the results, will have a shared decision-making discussion regarding her Lipitor.  Addendum Lipid panel results showed total cholesterol 138 and LDL 57.  10 years ASCVD 6.4%. We discussed her atorvastatin which patient would like to be taken off.  She states after she was notified about her hyperlipidemia, she has been more conscious about her health and her diet.  She also keeps good record of her blood sugar and blood pressure. I explained the importance of controlling cholesterol and her ASCVD risk.  Patient verbalizes understanding and would like to be off of a atorvastatin. -Will discontinue atorvastatin for now.  Recheck lipid panel and A1c in 3- 6 months.

## 2021-12-29 NOTE — Assessment & Plan Note (Addendum)
Blood pressure mildly elevated 146/67.  Patient brought her home blood pressure log from April until now.  All of her systolic blood pressure was in the 110 - 120.  Patient states that her blood pressure always higher in the doctor's office.  Her Coreg was discontinued in April due to hypotension.  -Continue amlodipine 10 mg -Check BMP today  Addendum: BMP unremarkable

## 2021-12-29 NOTE — Progress Notes (Signed)
   CC: Cholesterol check  HPI:  Becky Gross is a 66 y.o. past medical history of hypertension, hyperlipidemia who presents to clinic today for cholesterol check.  She has no other acute complaint.  Please see problem based charting for detail  Past Medical History:  Diagnosis Date   Hypertension    Review of Systems:  per HPI  Physical Exam:  Vitals:   12/29/21 1505  BP: (!) 146/67  Pulse: 60  Temp: 98.5 F (36.9 C)  TempSrc: Oral  SpO2: 100%  Weight: 139 lb 9.6 oz (63.3 kg)  Height: 5\' 1"  (1.549 m)   Physical Exam Constitutional:      General: She is not in acute distress.    Appearance: She is not ill-appearing.  HENT:     Head: Normocephalic.  Eyes:     General:        Right eye: No discharge.        Left eye: No discharge.     Conjunctiva/sclera: Conjunctivae normal.  Cardiovascular:     Rate and Rhythm: Normal rate and regular rhythm.  Pulmonary:     Effort: Pulmonary effort is normal. No respiratory distress.     Breath sounds: Normal breath sounds. No wheezing.  Abdominal:     General: Bowel sounds are normal.     Palpations: Abdomen is soft.  Musculoskeletal:     Comments: No pain to palpation of the left knee or left calf.  Skin:    General: Skin is warm.  Neurological:     Mental Status: She is alert and oriented to person, place, and time.     Comments: Normal gait  Psychiatric:        Mood and Affect: Mood normal.      Assessment & Plan:   See Encounters Tab for problem based charting.  Hypertension Blood pressure mildly elevated 146/67.  Patient brought her home blood pressure log from April until now.  All of her systolic blood pressure was in the 110 - 120.  Patient states that her blood pressure always higher in the doctor's office.  Her Coreg was discontinued in April due to hypotension.  -Continue amlodipine 10 mg -Check BMP today  Hyperlipidemia Patient currently taking atorvastatin 40 mg.  Her LDL was 44 in May 2022.   Patient asked if she can be off of Lipitor.  She report close monitoring of her blood pressure and CBG at home.  -Recheck lipid panel and A1c today.  Depends on the results, will have a shared decision-making discussion regarding her Lipitor.  Prediabetes Unfortunately we were not able to collect her A1C at this visit. Will obtain at next visit.   Healthcare maintenance - Declined hepatitis C screening - States that she had colonoscopy done every 5 years June 2022.  Her last colonoscopy was in 2019 and she is due for 2024. - Report received pneumonia shot last month in [place]. - DEXA scan ordered   Patient discussed with Dr.  [place]

## 2021-12-30 NOTE — Progress Notes (Signed)
Internal Medicine Clinic Attending  Case discussed with Dr. Nguyen  At the time of the visit.  We reviewed the resident's history and exam and pertinent patient test results.  I agree with the assessment, diagnosis, and plan of care documented in the resident's note. 

## 2021-12-31 LAB — BMP8+ANION GAP
Anion Gap: 15 mmol/L (ref 10.0–18.0)
BUN/Creatinine Ratio: 31 — ABNORMAL HIGH (ref 12–28)
BUN: 18 mg/dL (ref 8–27)
CO2: 24 mmol/L (ref 20–29)
Calcium: 9.3 mg/dL (ref 8.7–10.3)
Chloride: 102 mmol/L (ref 96–106)
Creatinine, Ser: 0.59 mg/dL (ref 0.57–1.00)
Glucose: 95 mg/dL (ref 70–99)
Potassium: 4.6 mmol/L (ref 3.5–5.2)
Sodium: 141 mmol/L (ref 134–144)
eGFR: 99 mL/min/{1.73_m2} (ref >59)

## 2021-12-31 LAB — LIPID PANEL
Chol/HDL Ratio: 2 ratio (ref 0.0–4.4)
Cholesterol, Total: 138 mg/dL (ref 100–199)
HDL: 70 mg/dL (ref >39)
LDL Chol Calc (NIH): 57 mg/dL (ref 0–99)
Triglycerides: 47 mg/dL (ref 0–149)
VLDL Cholesterol Cal: 11 mg/dL (ref 5–40)

## 2022-01-02 ENCOUNTER — Encounter: Payer: Self-pay | Admitting: Student

## 2022-01-02 ENCOUNTER — Telehealth: Payer: Self-pay | Admitting: Student

## 2022-01-02 NOTE — Addendum Note (Signed)
Addended byDoran Stabler on: 01/02/2022 10:59 AM   Modules accepted: Orders

## 2022-01-02 NOTE — Telephone Encounter (Signed)
Would like to get an additional refill on the Naproxen.

## 2022-01-02 NOTE — Telephone Encounter (Signed)
Refill Request-   naproxen (NAPROSYN) 500 MG tablet Take 1 tablet (500 mg total) by mouth 2 (two) times daily with a meal., Starting Thu 12/22/2021, Until Thu 12/29/2021, Normal   Dispense: 14 tablet  Refills: 2 ordered  Pharmacy: [id number] - [place] ([place] - [address] (Ph: [phone])

## 2022-01-03 NOTE — Telephone Encounter (Signed)
Call to [facility] patient has a refill left on the Naproxsyn. Pharmacy to fill.  Call to patient to pick up refill for the Naproxsyn.

## 2022-01-03 NOTE — Telephone Encounter (Signed)
Requesting Naproxen to be filled by today. States she really need this for her pain. Please call pt back.

## 2022-01-19 ENCOUNTER — Ambulatory Visit (INDEPENDENT_AMBULATORY_CARE_PROVIDER_SITE_OTHER): Payer: Commercial Managed Care - HMO | Admitting: Internal Medicine

## 2022-01-19 ENCOUNTER — Encounter: Payer: Self-pay | Admitting: Internal Medicine

## 2022-01-19 ENCOUNTER — Other Ambulatory Visit: Payer: Self-pay

## 2022-01-19 VITALS — BP 140/64 | HR 65 | Temp 98.6°F | Ht 60.0 in | Wt 138.4 lb

## 2022-01-19 DIAGNOSIS — G8929 Other chronic pain: Secondary | ICD-10-CM | POA: Diagnosis not present

## 2022-01-19 DIAGNOSIS — M25562 Pain in left knee: Secondary | ICD-10-CM

## 2022-01-19 DIAGNOSIS — R7303 Prediabetes: Secondary | ICD-10-CM | POA: Diagnosis not present

## 2022-01-19 LAB — POCT GLYCOSYLATED HEMOGLOBIN (HGB A1C): Hemoglobin A1C: 6 % — AB (ref 4.0–5.6)

## 2022-01-19 LAB — GLUCOSE, CAPILLARY: Glucose-Capillary: 114 mg/dL — ABNORMAL HIGH (ref 70–99)

## 2022-01-19 MED ORDER — ACETAMINOPHEN 500 MG PO TABS: 1000.0000 mg | ORAL_TABLET | Freq: Three times a day (TID) | ORAL | 0 refills | Status: AC | PRN

## 2022-01-19 MED ORDER — NAPROXEN 500 MG PO TABS: 500.0000 mg | ORAL_TABLET | Freq: Every day | ORAL | 0 refills | Status: DC | PRN

## 2022-01-19 NOTE — Assessment & Plan Note (Signed)
A1c improved from 6.4% to 6.0% - managed through diet alone. Patient has cut out rice and pasta and has continued to work on weight loss.

## 2022-01-19 NOTE — Assessment & Plan Note (Signed)
The patient presents as a follow-up to discuss her chronic left leg/knee pain.  Patient previously worked at [employer] in which her job involves a lot of heavy lifting and standing all day.  The patient's pain is worsened by the end of the day and is improved with the use of naproxen and heating pad.  She has tried to use Voltaren gel with no relief.  Her pain currently is a 2 out of 10 but she states that by the end of the night it is a 6+/10.  She was previously taking naproxen 500 mg twice a day however she has been told to only take this once a day now.  This does still help to keep her pain under control, however, her pain is worsened by the time the naproxen wears off.   On exam the patient's left knee is edematous compared to the right knee, however, there is no effusion that is palpable.  The left knee does demonstrate some clicking with extension and flexion of the left knee.  There is no laxity or clicking with varus or valgus stressing.  I suspect that the patient's pain is related to osteoarthritis, however, we do not have any imaging of her knee.  Plan: - Continue naproxen 500 mg daily prn - Can also take tylenol (up to 1000 mg tid prn) - Continue heating pad - Knee xray (weight bearing, AP, lateral, sunrise view) to be done tomorrow (patient not able to go today)

## 2022-01-19 NOTE — Patient Instructions (Signed)
Thank you, Becky Gross for allowing [place] to provide your care today. Today we discussed:  Knee pain: Please come back to get an xray of your left knee. You can keep taking naproxen once a day for your pain and you can also take tylenol up to 3 times a day to help the pain. Continue to use a heating pad if it helps.    I have ordered the following labs for you:   Lab Orders         Glucose, capillary         POC Hbg A1C      Tests ordered today:  Left knee xray  Referrals ordered today:   Referral Orders  No referral(s) requested today     I have ordered the following medication/changed the following medications:   Stop the following medications: Medications Discontinued During This Encounter  Medication Reason   cyclobenzaprine (FLEXERIL) 10 MG tablet Patient has not taken in last 30 days   acetaminophen (TYLENOL) 325 MG tablet      Start the following medications: Meds ordered this encounter  Medications   acetaminophen (TYLENOL) 500 MG tablet    Sig: Take 2 tablets (1,000 mg total) by mouth every 8 (eight) hours as needed.    Dispense:  30 tablet    Refill:  0     Follow up: 6 months    Remember: To continue to watch your diet- your sugars are looking much better!  Should you have any questions or concerns please call the internal medicine clinic at [phone].     Elza Rafter, D.O. [facility]

## 2022-01-19 NOTE — Progress Notes (Signed)
CC: left knee pain  HPI:  Becky Gross is a 66 y.o. female living with a history stated below and presents today for left knee pain. Please see problem based assessment and plan for additional details.  Past Medical History:  Diagnosis Date   Hypertension     Current Outpatient Medications on File Prior to Visit  Medication Sig Dispense Refill   amLODipine (NORVASC) 10 MG tablet Take 1 tablet (10 mg total) by mouth daily. 90 tablet 0   diclofenac Sodium (VOLTAREN) 1 % GEL Apply 4 g topically 4 (four) times daily. 150 g 1   polyethylene glycol (MIRALAX) 17 g packet Take 17 g by mouth daily. 14 each 5   No current facility-administered medications on file prior to visit.    Family History  Problem Relation Age of Onset   Breast cancer Neg Hx     Social History   Socioeconomic History   Marital status: Legally Separated    Spouse name: Not on file   Number of children: Not on file   Years of education: Not on file   Highest education level: Not on file  Occupational History   Not on file  Tobacco Use   Smoking status: Never   Smokeless tobacco: Never  Vaping Use   Vaping Use: Never used  Substance and Sexual Activity   Alcohol use: Not Currently   Drug use: Not Currently   Sexual activity: Not on file  Other Topics Concern   Not on file  Social History Narrative   Not on file   Social Determinants of Health   Financial Resource Strain: Not on file  Food Insecurity: Not on file  Transportation Needs: Not on file  Physical Activity: Not on file  Stress: Not on file  Social Connections: Not on file  Intimate Partner Violence: Not on file    Review of Systems: ROS negative except for what is noted on the assessment and plan.  Vitals:   01/19/22 1012  BP: (!) 140/64  Pulse: 65  Temp: 98.6 F (37 C)  TempSrc: Oral  SpO2: 100%  Weight: 138 lb 6.4 oz (62.8 kg)  Height: 5' (1.524 m)    Physical Exam: Constitutional: well-appearing female  sitting in chair, in no acute distress Cardiovascular: regular rate and rhythm, no m/r/g Pulmonary/Chest: normal work of breathing on room air, lungs clear to auscultation bilaterally MSK: normal bulk and tone. Left knee is edematous compared to right knee. Left knee is not erythematous or warm to touch. There is clicking of the joint upon flexion/extension. No clicking/laxity with valgus and varus stressing.  Neurological: alert & oriented x 3, no focal deficit Skin: warm and dry Psych: normal mood and behavior  Assessment & Plan:    Patient discussed with Dr. Heide Spark  Prediabetes A1c improved from 6.4% to 6.0% - managed through diet alone. Patient has cut out rice and pasta and has continued to work on weight loss.   Left knee pain The patient presents as a follow-up to discuss her chronic left leg/knee pain.  Patient previously worked at [employer] in which her job involves a lot of heavy lifting and standing all day.  The patient's pain is worsened by the end of the day and is improved with the use of naproxen and heating pad.  She has tried to use Voltaren gel with no relief.  Her pain currently is a 2 out of 10 but she states that by the end of the night it is  a 6+/10.  She was previously taking naproxen 500 mg twice a day however she has been told to only take this once a day now.  This does still help to keep her pain under control, however, her pain is worsened by the time the naproxen wears off.   On exam the patient's left knee is edematous compared to the right knee, however, there is no effusion that is palpable.  The left knee does demonstrate some clicking with extension and flexion of the left knee.  There is no laxity or clicking with varus or valgus stressing.  I suspect that the patient's pain is related to osteoarthritis, however, we do not have any imaging of her knee.  Plan: - Continue naproxen 500 mg daily prn - Can also take tylenol (up to 1000 mg tid prn) - Continue  heating pad - Knee xray (weight bearing, AP, lateral, sunrise view) to be done tomorrow (patient not able to go today)   Amry Cathy, D.O. [facility], PGY-2 Phone: [phone] Date 01/19/2022 Time 1:20 PM

## 2022-01-20 NOTE — Progress Notes (Signed)
Internal Medicine Clinic Attending ° °Case discussed with Dr. Atway  At the time of the visit.  We reviewed the resident’s history and exam and pertinent patient test results.  I agree with the assessment, diagnosis, and plan of care documented in the resident’s note.  °

## 2022-02-08 ENCOUNTER — Other Ambulatory Visit: Payer: Self-pay | Admitting: Internal Medicine

## 2022-02-08 DIAGNOSIS — G8929 Other chronic pain: Secondary | ICD-10-CM

## 2022-03-06 ENCOUNTER — Other Ambulatory Visit: Payer: Self-pay

## 2022-03-06 DIAGNOSIS — I10 Essential (primary) hypertension: Secondary | ICD-10-CM

## 2022-03-06 MED ORDER — AMLODIPINE BESYLATE 10 MG PO TABS: 10.0000 mg | ORAL_TABLET | Freq: Every day | ORAL | 0 refills | Status: DC

## 2022-04-19 ENCOUNTER — Ambulatory Visit (INDEPENDENT_AMBULATORY_CARE_PROVIDER_SITE_OTHER): Payer: Commercial Managed Care - HMO | Admitting: Student

## 2022-04-19 ENCOUNTER — Encounter: Payer: Self-pay | Admitting: Student

## 2022-04-19 VITALS — BP 157/74 | HR 74 | Wt 141.7 lb

## 2022-04-19 DIAGNOSIS — M545 Low back pain, unspecified: Secondary | ICD-10-CM

## 2022-04-19 DIAGNOSIS — L239 Allergic contact dermatitis, unspecified cause: Secondary | ICD-10-CM

## 2022-04-19 DIAGNOSIS — M25562 Pain in left knee: Secondary | ICD-10-CM | POA: Diagnosis not present

## 2022-04-19 MED ORDER — HYDROCORTISONE 1 % EX CREA: TOPICAL_CREAM | CUTANEOUS | 1 refills | Status: AC

## 2022-04-19 MED ORDER — METHOCARBAMOL 500 MG PO TABS
500.0000 mg | ORAL_TABLET | Freq: Two times a day (BID) | ORAL | 0 refills | Status: AC | PRN
Start: 2022-04-19 — End: 2022-04-26

## 2022-04-19 NOTE — Assessment & Plan Note (Signed)
This is a chronic issue.  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  She has chronic back pain from heavy lifting at her prior job.  She states the pain intensity is stable, does not radiate down to her legs and denies urinary/BM issue.  She states that naproxen helped significantly with pain relief but she does not like to take medication.  She only take 1 tablet a week.  She states that heat and massage also helps.  Physical exam showed hypertonicity of bilateral paraspinal muscle of the lower lumbar.  There was mild tenderness to palpation, left worse than right.  Negative straight leg raise test.  Normal ROM of bilateral hip joints.  -Advised patient to continue naproxen as needed. -Referral to physical therapy -Added a short course of Robaxin -She can also tried topical treatment such as Biofreeze or Salonpas patches

## 2022-04-19 NOTE — Patient Instructions (Signed)
Becky Gross,  It was nice seeing you in the clinic today.  I am sorry that your back pain and knee pain still bothering you.  Please continue taking naproxen as needed for pain.  You can take it more often if you need to.  I placed a referral to physical therapy if they will call you for an appointment.  I also prescribed a short course of muscle relaxant, Robaxin.  You can take it at bedtime because this medication can be sedating.  You can also try Biofreeze or Salonpas patches over-the-counter.  Please return in 3 months  Take care

## 2022-04-19 NOTE — Progress Notes (Signed)
CC: Chronic back pain and left knee pain  HPI:  [name] is a 66 y.o. past medical history of hypertension, hyperlipidemia, who presents to the clinic for her chronic lower back pain and left knee pain.  Please see problem based charting for detail  Past Medical History:  Diagnosis Date   Hypertension    Review of Systems:  per HPI  Physical Exam:  Vitals:   04/19/22 1013  BP: (!) 157/74  Pulse: 74  SpO2: 99%  Weight: 141 lb 11.2 oz (64.3 kg)   Physical Exam Constitutional:      General: She is not in acute distress.    Appearance: She is not ill-appearing.  HENT:     Head: Normocephalic.  Eyes:     General:        Right eye: No discharge.        Left eye: No discharge.     Conjunctiva/sclera: Conjunctivae normal.  Pulmonary:     Effort: Pulmonary effort is normal. No respiratory distress.  Musculoskeletal:     Cervical back: Normal range of motion.     Comments: Hypertonicity of bilateral paraspinal muscle at the lower lumbar, tender to palpation bilaterally, left worse than right.  Negative straight leg raise test.  Normal ROM of bilateral hip joints. Left knee nontender to palpation.  Mild tenderness to palpation in the anterior tibia on the left side.  Skin:    General: Skin is warm.     Comments: There is a round area at her in right cubital fossa that is mildly erythematous.  The surrounding skin is dry and flaky.  Neurological:     Mental Status: She is alert.  Psychiatric:        Mood and Affect: Mood normal.      Assessment & Plan:   See Encounters Tab for problem based charting.  Low back pain with muscle spasm This is a chronic issue.  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  She is now working as a Engineer, water in a hotel.  She has chronic back pain from heavy lifting at her prior job.  She states the pain intensity is stable, does not radiate down to her legs and denies urinary/BM issue.  She  states that naproxen helped significantly with pain relief but she does not like to take medication.  She only take 1 tablet a week.  She states that heat and massage also helps.  Physical exam showed hypertonicity of bilateral paraspinal muscle of the lower lumbar.  There was mild tenderness to palpation, left worse than right.  Negative straight leg raise test.  Normal ROM of bilateral hip joints.  -Advised patient to continue naproxen as needed. -Referral to physical therapy -Added a short course of Robaxin -She can also tried topical treatment such as Biofreeze or Salonpas patches   Left knee pain This is a chronic issue, likely from heavy lifting at her prior job.  She said pain intensity is stable and localized mostly in the anterior left tibia.  Pain is worse at the end of the day and relieved with naproxen and massage.  She said that she is working a different job as a Engineer, water at [employer] but she has to be on her feet for long hours.  Physical exam: Left knee nontender to palpation.  There was mild tenderness to palpation in the anterior tibia at the left side.  No obvious effusion palpated.  -  Continue naproxen as needed -Physical therapy referral placed  Allergic dermatitis Patient report an area of itching on her right antecubital fossa after spraying insect repellent there.  The area initially was erythematous and pruritic which she was scratching frequently.  She also use heat compress which helped with the pruritus.  States that the redness and the itchiness has improved but not completely.  The exam showed a round area of the right cubital fossa that is mildly erythematous.  The surrounding skin is dry and flaky.  This is likely allergic dermatitis from chemical.  Advised patient to use hydrocortisone cream   Patient discussed with Dr. Jimmye Norman

## 2022-04-19 NOTE — Assessment & Plan Note (Addendum)
This is a chronic issue, likely from heavy lifting at her prior job.  She said pain intensity is stable and localized mostly in the anterior left tibia.  Pain is worse at the end of the day and relieved with naproxen and massage.  She said that she is working a different job as a Scientist, product/process development at [employer] but she has to be on her feet for long hours.  Physical exam: Left knee nontender to palpation.  There was mild tenderness to palpation in the anterior tibia at the left side.  No obvious effusion palpated.  -Continue naproxen as needed -Physical therapy referral placed

## 2022-04-19 NOTE — Assessment & Plan Note (Signed)
Patient report an area of itching on her right antecubital fossa after spraying insect repellent there.  The area initially was erythematous and pruritic which she was scratching frequently.  She also use heat compress which helped with the pruritus.  States that the redness and the itchiness has improved but not completely.  The exam showed a round area of the right cubital fossa that is mildly erythematous.  The surrounding skin is dry and flaky.  This is likely allergic dermatitis from chemical.  Advised patient to use hydrocortisone cream

## 2022-04-20 NOTE — Progress Notes (Signed)
Internal Medicine Clinic Attending  Case discussed with Dr. Nguyen  At the time of the visit.  We reviewed the resident's history and exam and pertinent patient test results.  I agree with the assessment, diagnosis, and plan of care documented in the resident's note. 

## 2022-06-02 ENCOUNTER — Other Ambulatory Visit: Payer: Self-pay | Admitting: Student

## 2022-06-02 DIAGNOSIS — I10 Essential (primary) hypertension: Secondary | ICD-10-CM

## 2022-06-22 ENCOUNTER — Ambulatory Visit (INDEPENDENT_AMBULATORY_CARE_PROVIDER_SITE_OTHER): Payer: Commercial Managed Care - HMO | Admitting: Internal Medicine

## 2022-06-22 ENCOUNTER — Other Ambulatory Visit: Payer: Self-pay

## 2022-06-22 ENCOUNTER — Encounter: Payer: Self-pay | Admitting: Internal Medicine

## 2022-06-22 ENCOUNTER — Ambulatory Visit (HOSPITAL_COMMUNITY)
Admission: RE | Admit: 2022-06-22 | Discharge: 2022-06-22 | Disposition: A | Discharge status: Home / Self Care | Payer: BLUE CROSS/BLUE SHIELD | Source: Ambulatory Visit | Attending: Internal Medicine | Admitting: Internal Medicine

## 2022-06-22 VITALS — BP 132/53 | HR 67 | Temp 98.9°F | Wt 144.5 lb

## 2022-06-22 DIAGNOSIS — Z1211 Encounter for screening for malignant neoplasm of colon: Secondary | ICD-10-CM

## 2022-06-22 DIAGNOSIS — G8929 Other chronic pain: Secondary | ICD-10-CM

## 2022-06-22 DIAGNOSIS — M25562 Pain in left knee: Secondary | ICD-10-CM

## 2022-06-22 DIAGNOSIS — I10 Essential (primary) hypertension: Secondary | ICD-10-CM

## 2022-06-22 DIAGNOSIS — Z0000 Encounter for general adult medical examination without abnormal findings: Secondary | ICD-10-CM

## 2022-06-22 MED ORDER — NAPROXEN 500 MG PO TABS: 500.0000 mg | ORAL_TABLET | Freq: Every day | ORAL | 0 refills | Status: AC | PRN

## 2022-06-22 MED ORDER — DICLOFENAC SODIUM 1 % EX GEL: 4.0000 g | Freq: Four times a day (QID) | CUTANEOUS | 1 refills | Status: AC

## 2022-06-22 NOTE — Progress Notes (Unsigned)
   CC: Knee Pain  HPI:  [name] is a 67 y.o. with medical history of HTN, HLD, chronic back pain, and OA presenting to [facility] for knee pain. Dr. Allyson Sabal is PCP and last seen by Dr. Alfonse Spruce in 04/2022.  Please see problem-based list for further details, assessments, and plans.  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   naproxen (NAPROSYN) 500 MG tablet, Take 1 tablet (500 mg total) by mouth daily as needed.   Current Outpatient Medications (Other):    diclofenac Sodium (VOLTAREN) 1 % GEL, Apply 4 g topically 4 (four) times daily.   hydrocortisone cream 1 %, Apply to affected area 2 times daily   polyethylene glycol (MIRALAX) 17 g packet, Take 17 g by mouth daily.  Review of Systems:  Review of system negative unless stated in the problem list or HPI.    Physical Exam:  Vitals:   06/22/22 0902  BP: (!) 132/53  Pulse: 67  Temp: 98.9 F (37.2 C)  TempSrc: Oral  SpO2: 99%  Weight: 144 lb 8 oz (65.5 kg)    Physical Exam General: NAD HENT: NCAT Lungs: CTAB, no wheeze, rhonchi or rales.  Cardiovascular: Normal heart sounds, no r/m/g, 2+ pulses in all extremities. No LE edema Abdomen: No TTP, normal bowel sounds MSK: No asymmetry or muscle atrophy. Good range of motion of the knee, No TTP of the knee.  Skin: no lesions noted on exposed skin Neuro: Alert and oriented x4. CN grossly intact Psych: Normal mood and normal affect   Assessment & Plan:   Left knee pain Pt with continued complaint of knee pain. She states the conservative measures are working. PT referral placed last visit. X rays was ordered but not  completed. Her knee pain for about one year. She takes the naproxen takes the pain away and pt takes it only when needed about 1-2 tablet a weeks. Advised pt to complete the x ray to the degree of osteoarthritis. 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  -Advised use of voltaren gel.  -Continue Naproxen sparingly but not regularly -Continue taking tylenol as needed up to 3000 mg per day.  -If pain continues can consider steroid injection but no current need.   Hypertension BP more controlled today at 132/53. Her diastolic is low but pt is asymptomatic and her MAP is >65.  -Continue amlodipine 10 mg qd.   Healthcare maintenance Referral to GI placed for colonoscopy placed. Dexa scan is still pending.    See Encounters Tab for problem based charting.  Patient discussed with Dr. Edrick Kins, MD Tillie Rung. [facility] Internal Medicine Residency, PGY-2

## 2022-06-22 NOTE — Patient Instructions (Signed)
Becky Gross, it was a pleasure seeing you today! You endorsed feeling well today. Below are some of the things we talked about this visit. We look forward to seeing you in the follow up appointment!  Today we discussed: For your knee pain, continue your current measures. I will prescribe you Voltaren gel which you can apply up to 4 times a day. You can use Naproxen as needed but don't use it daily. I am giving you some exercises to do that may help with the pain. We will get an x ray of the knee that was ordered.  I will place a referral for colonoscopy.   I have ordered the following labs today:  Lab Orders  No laboratory test(s) ordered today      Referrals ordered today:   Referral Orders  No referral(s) requested today     I have ordered the following medication/changed the following medications:   Stop the following medications: There are no discontinued medications.   Start the following medications: No orders of the defined types were placed in this encounter.    Follow-up: 3 months or earlier if needed  Please make sure to arrive 15 minutes prior to your next appointment. If you arrive late, you may be asked to reschedule.   We look forward to seeing you next time. Please call our clinic at [phone] if you have any questions or concerns. The best time to call is Monday-Friday from 9am-4pm, but there is someone available 24/7. If after hours or the weekend, call the main hospital number and ask for the Internal Medicine Resident On-Call. If you need medication refills, please notify your pharmacy one week in advance and they will send [place] a request.  Thank you for letting us take part in your care. Wishing you the best!  Thank you, Idamae Schuller, MD

## 2022-06-24 NOTE — Assessment & Plan Note (Signed)
Pt with continued complaint of knee pain. She states the conservative measures are working. PT referral placed last visit. X rays was ordered but not completed. Her knee pain for about one year. She takes the naproxen takes the pain away and pt takes it only when needed about 1-2 tablet a weeks. Advised pt to complete the x ray to the degree of osteoarthritis. 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  -Advised use of voltaren gel.  -Continue Naproxen sparingly but not regularly -Continue taking tylenol as needed up to 3000 mg per day.  -If pain continues can consider steroid injection but no current need.

## 2022-06-24 NOTE — Assessment & Plan Note (Addendum)
Referral to GI placed for colonoscopy placed. Dexa scan is still pending.

## 2022-06-24 NOTE — Assessment & Plan Note (Signed)
BP more controlled today at 132/53. Her diastolic is low but pt is asymptomatic and her MAP is >65.  -Continue amlodipine 10 mg qd.

## 2022-06-26 NOTE — Progress Notes (Signed)
Internal Medicine Clinic Attending  Case discussed with Dr. Khan  At the time of the visit.  We reviewed the resident's history and exam and pertinent patient test results.  I agree with the assessment, diagnosis, and plan of care documented in the resident's note.  

## 2022-06-29 ENCOUNTER — Telehealth: Payer: Self-pay | Admitting: *Deleted

## 2022-06-29 NOTE — Telephone Encounter (Signed)
Forwarding to the order provider

## 2022-06-29 NOTE — Telephone Encounter (Signed)
Patient is requesting to have the xray results mailed to her. Stated she did not understand what the Doctor was saying that call her with the results. Please mail so she will understand per patient.

## 2022-06-30 ENCOUNTER — Encounter: Payer: Self-pay | Admitting: Internal Medicine

## 2022-07-10 ENCOUNTER — Telehealth: Payer: Self-pay

## 2022-07-10 NOTE — Telephone Encounter (Signed)
Note from Dr. Humphrey Rolls states he mailed the results of knee x-ray on 1/12. Returned call to patient. Explained this and that second copy was placed in today's outgoing mail.  She states she is supposed to have colonoscopy every 5 years and has not received call to schedule. Referral to GI was placed on 06/22/22. Will forward to Referral Coordinator for status update.

## 2022-07-10 NOTE — Telephone Encounter (Signed)
Pt is requesting a call back .Becky Gross She stated that she had told her Dr that she wanted her results to be mailed to her and it it now going on 2 wks and she still has not gotten them

## 2022-07-19 LAB — HM COLONOSCOPY

## 2022-08-17 ENCOUNTER — Other Ambulatory Visit: Payer: Self-pay | Admitting: Internal Medicine

## 2022-08-17 ENCOUNTER — Encounter: Payer: Self-pay | Admitting: Internal Medicine

## 2022-08-17 DIAGNOSIS — Z1231 Encounter for screening mammogram for malignant neoplasm of breast: Secondary | ICD-10-CM

## 2022-08-22 ENCOUNTER — Other Ambulatory Visit: Payer: Self-pay

## 2022-08-22 DIAGNOSIS — I10 Essential (primary) hypertension: Secondary | ICD-10-CM

## 2022-08-22 MED ORDER — METHOCARBAMOL 500 MG PO TABS: 500.0000 mg | ORAL_TABLET | Freq: Three times a day (TID) | ORAL | 0 refills | Status: AC | PRN

## 2022-08-22 MED ORDER — AMLODIPINE BESYLATE 10 MG PO TABS: 10.0000 mg | ORAL_TABLET | Freq: Every day | ORAL | 0 refills | Status: AC

## 2022-10-05 ENCOUNTER — Ambulatory Visit
Admission: RE | Admit: 2022-10-05 | Discharge: 2022-10-05 | Disposition: A | Discharge status: Home / Self Care | Payer: BLUE CROSS/BLUE SHIELD | Source: Ambulatory Visit | Attending: Internal Medicine | Admitting: Internal Medicine

## 2022-10-05 DIAGNOSIS — Z1231 Encounter for screening mammogram for malignant neoplasm of breast: Secondary | ICD-10-CM

## 2023-09-04 ENCOUNTER — Other Ambulatory Visit: Payer: Self-pay | Admitting: Nurse Practitioner

## 2023-09-04 DIAGNOSIS — Z1231 Encounter for screening mammogram for malignant neoplasm of breast: Secondary | ICD-10-CM

## 2023-10-11 ENCOUNTER — Ambulatory Visit
Admission: RE | Admit: 2023-10-11 | Discharge: 2023-10-11 | Disposition: A | Discharge status: Home / Self Care | Source: Ambulatory Visit | Attending: Nurse Practitioner | Admitting: Nurse Practitioner

## 2023-10-11 DIAGNOSIS — Z1231 Encounter for screening mammogram for malignant neoplasm of breast: Secondary | ICD-10-CM

## 2024-01-19 IMAGING — MG MM DIGITAL SCREENING BILAT W/ TOMO AND CAD
6 of 12 series · 6 of 36 positions shown · non-contrast
Comparison: None.

CLINICAL DATA: Screening.

EXAM:
DIGITAL SCREENING BILATERAL MAMMOGRAM WITH TOMOSYNTHESIS AND CAD
TECHNIQUE: Bilateral screening digital craniocaudal and mediolateral oblique
mammograms were obtained. Bilateral screening digital breast
tomosynthesis was performed. The images were evaluated with
computer-aided detection.

[L MLO synth-2D (1 of 2)]
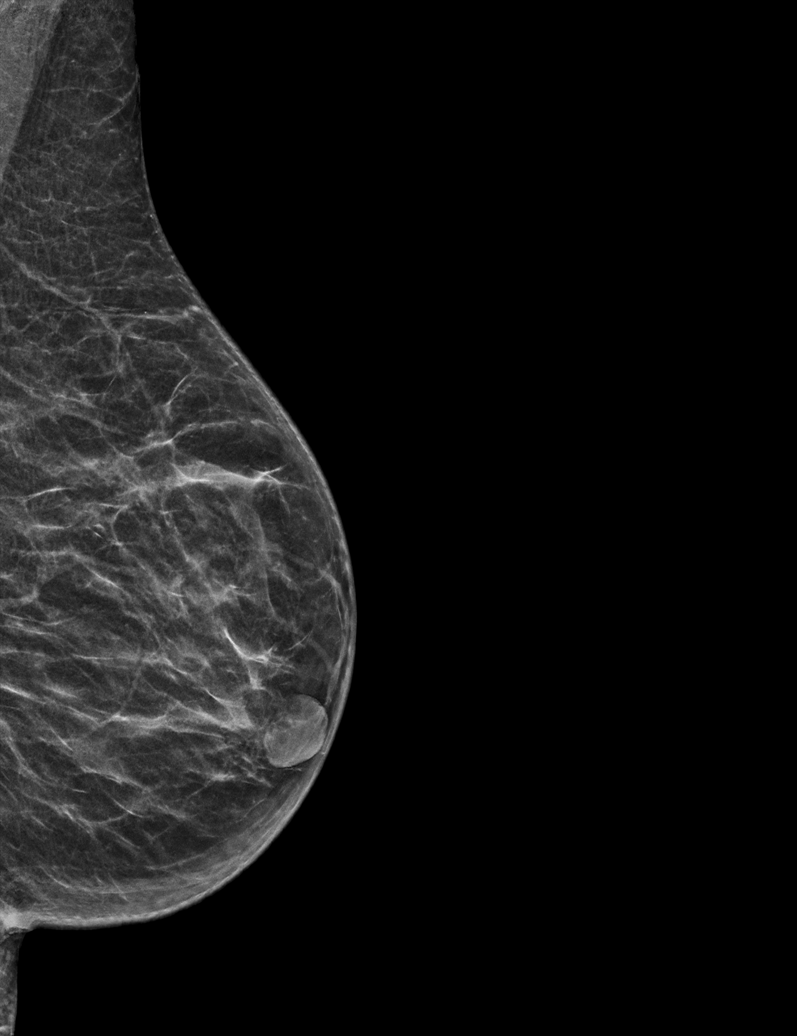

[R MLO synth-2D (1 of 2)]
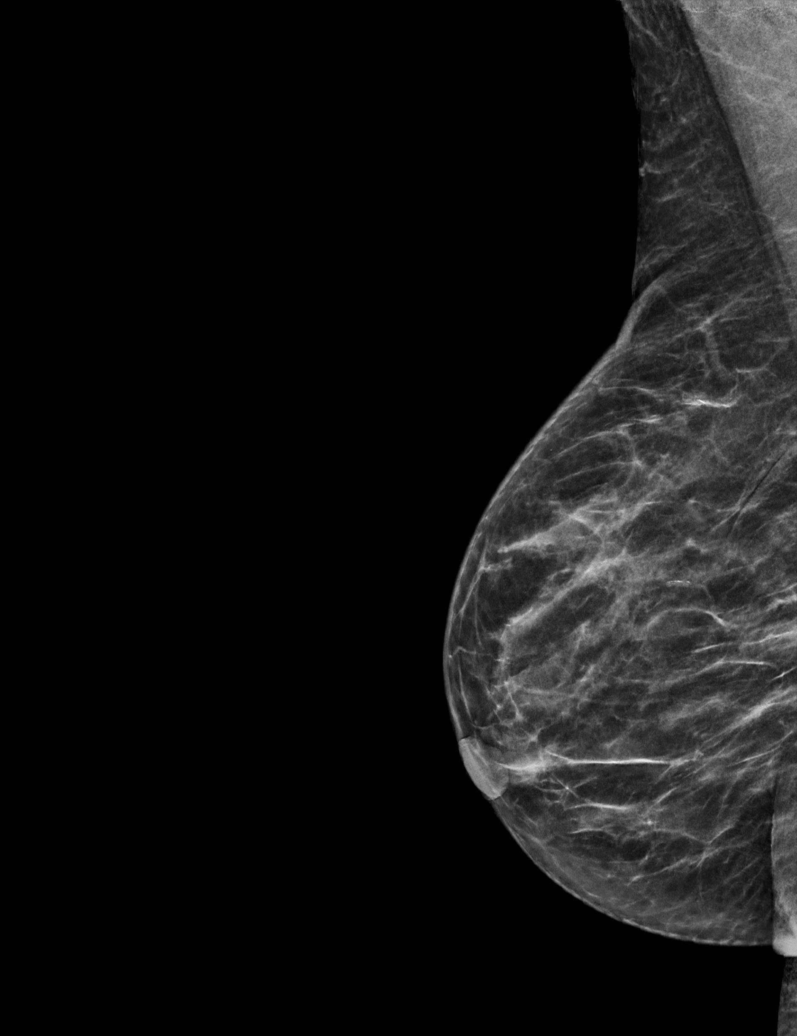

[R CC synth-2D]
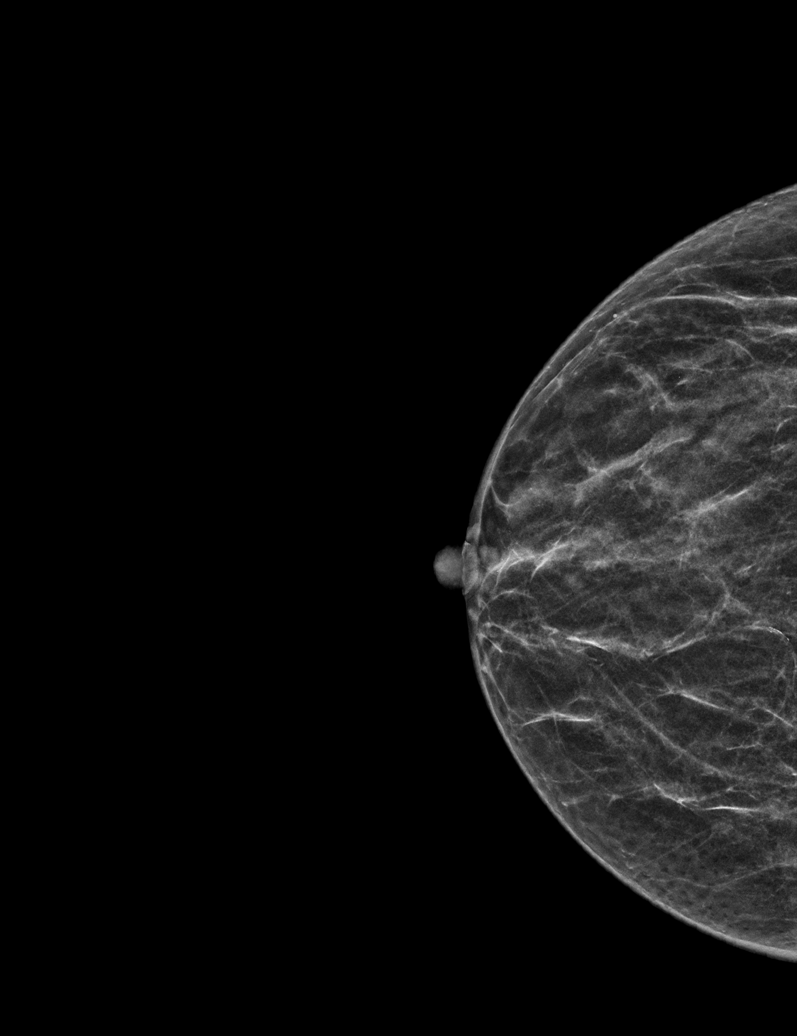

[R MLO synth-2D (2 of 2)]
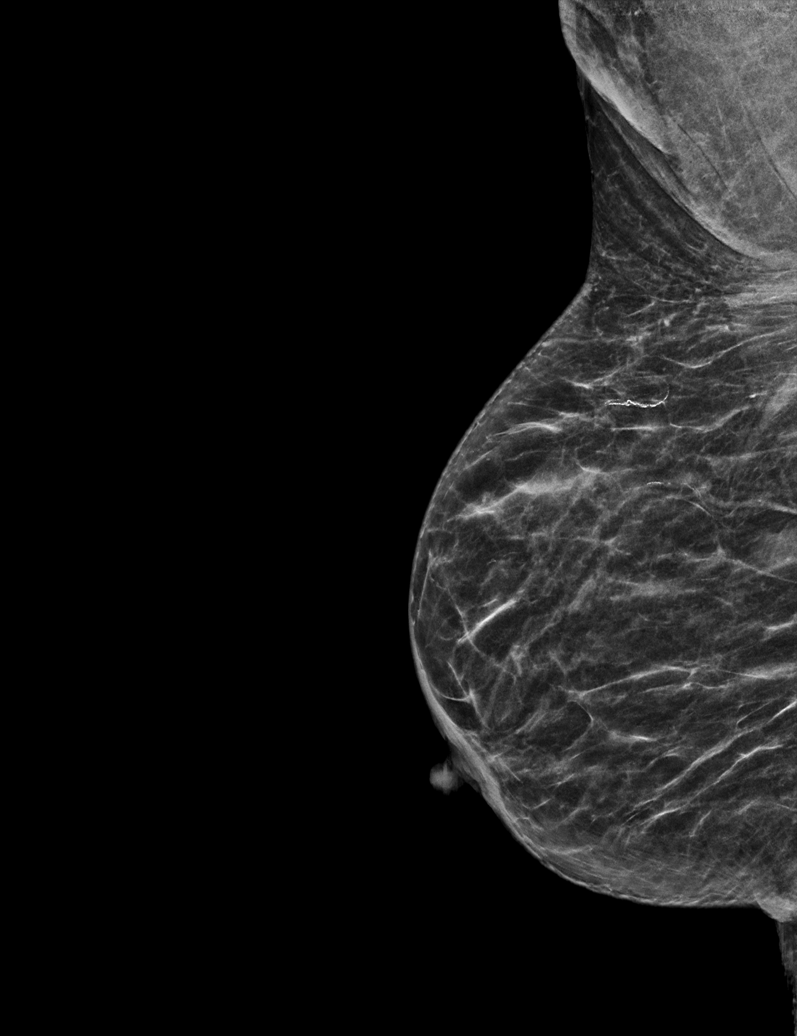

[L CC synth-2D]
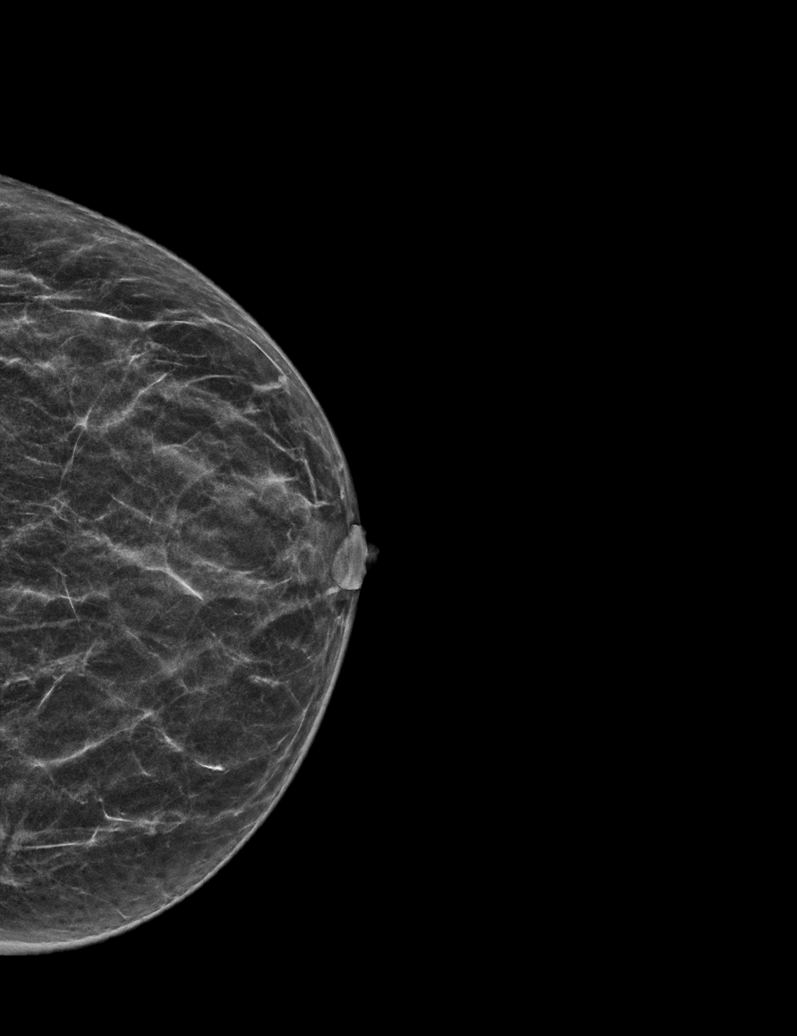

[L MLO synth-2D (2 of 2)]
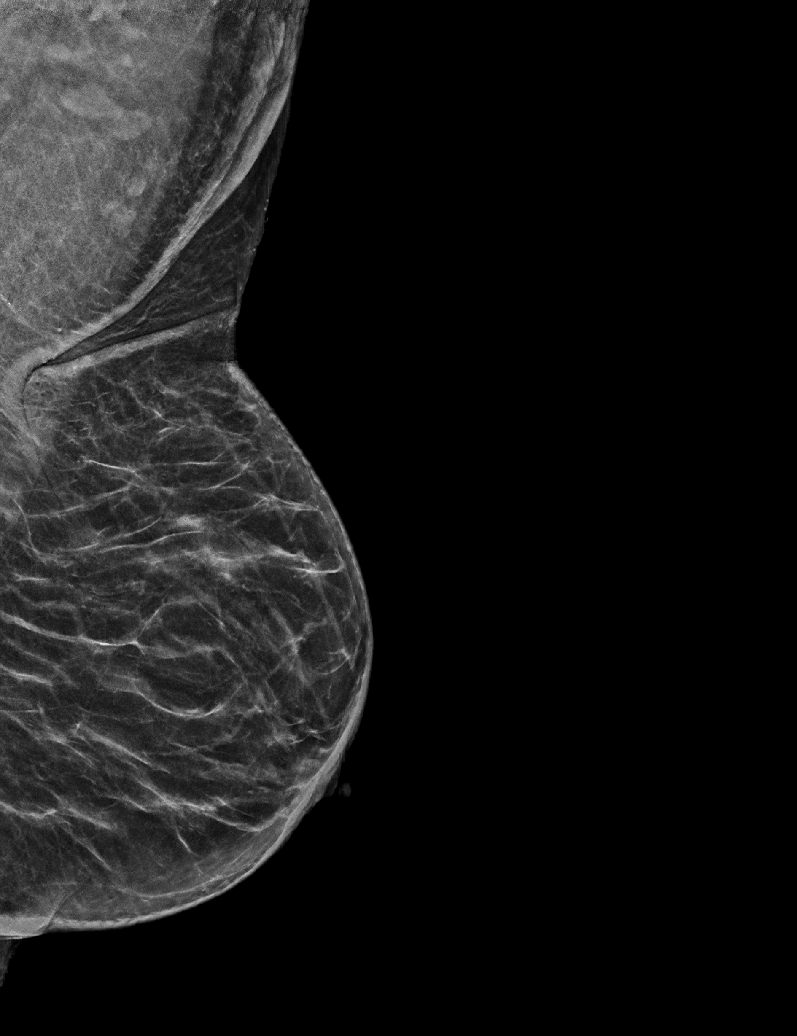

[6 of 36 positions shown; findings below may reference images not displayed]

ACR Breast Density Category b: There are scattered areas of
fibroglandular density.
FINDINGS: There are no findings suspicious for malignancy.
IMPRESSION: No mammographic evidence of malignancy. A result letter of this
screening mammogram will be mailed directly to the patient.

RECOMMENDATION:
Screening mammogram in one year. (Code:XG-X-X7B)

BI-RADS CATEGORY  1: Negative.

## 2024-05-08 NOTE — Progress Notes (Signed)
 SUBJECTIVE   Patient ID: Becky Gross is a 68 y.o. (DOB 09/15/1955) female  Chief Complaint  Patient presents with  . Back Pain    Right side x 1 month   . sciatica pain     Right side    She reports right sided low back pain. Increased pain with bending forward. Pain radiates to right buttock. Has tried Robaxin  and Naproxen  with temporary relief Back pain is daily, but mostly with bending.  OBJECTIVE   Allergies[1]  Current Medications[2]  Problem List[3]  Medical History[4]    Surgical History[5]  Family History[6]  BP 148/71 (BP Location: Right arm, Patient Position: Sitting)   Pulse 67   Wt 64.9 kg (143 lb)   SpO2 99%   BMI 27.00 kg/m   Review of Systems Systemic: Feeling fine.  No fever  and no chills. Head: No headache. Eyes: No vision problems. Cardiovascular: No chest pain or discomfort. Pulmonary: No dyspnea. Gastrointestinal: No abdominal pain. Psychological: No depression.   Constitutional: Oriented to person, place and time. Appears well-developed and well nourished. Physical Exam Musculoskeletal:     Lumbar back: Tenderness and bony tenderness present. No swelling, edema, deformity, signs of trauma, lacerations or spasms. Normal range of motion. Positive right straight leg raise test. Negative left straight leg raise test.     Comments: She is able to do forward flexion, lateral movement with no difficulties. She is able to walk on plantar flexion. LLE weakness compared to RLE.  Neurological:     Gait: Gait is intact.     Deep Tendon Reflexes: Reflexes are normal and symmetric.     Comments: Weakness LLE compared to RLE.       ASSESSMENT/PLAN   1. Acute left-sided low back pain with right-sided sciatica  XR Spine Lumbar Complete 4+ Views      Patient Instructions  1. Acute left-sided low back pain with right-sided sciatica (Primary) Lumbar spine Xray Start Gabapentin 300 mg once daily at bedtime for 1 week, then 300 mg twice daily  for 1 week, then 300 mg 3 times daily. Follow up 3-4 weeks. - XR Spine Lumbar Complete 4+ Views; Future   Return in about 4 weeks (around 06/05/2024) for back pain, no labs..  Orders Placed This Encounter  Procedures  . XR Spine Lumbar Complete 4+ Views    Requested Prescriptions   Signed Prescriptions Disp Refills  . gabapentin (NEURONTIN) 300 mg capsule 90 capsule 1    Sig: Take 1 capsule (300 mg total) by mouth 3 (three) times a day.    Risks, benefits, and alternatives of the medication(s) and treatment plan(s) were discussed, and she expressed understanding. Plan follow up as discussed or as needed. No barriers to treatment identified in this visit.            [1] No Known Allergies [2] Current Outpatient Medications  Medication Sig Dispense Refill  . amLODIPine  (NORVASC ) 10 mg tablet Take 1 tablet (10 mg total) by mouth daily. 90 tablet 1  . methocarbamoL  (ROBAXIN ) 500 mg tablet Take 1 tablet (500 mg total) by mouth 2 (two) times a day. 90 tablet 1  . naproxen  (NAPROSYN ) 500 mg tablet Take 1 tablet (500 mg total) by mouth in the morning and 1 tablet (500 mg total) in the evening. Take with meals. SCHEDULE APPT FOR ADDITIONAL REFILLS. 30 tablet 0  . gabapentin (NEURONTIN) 300 mg capsule Take 1 capsule (300 mg total) by mouth 3 (three) times a day. 90 capsule 1  No current facility-administered medications for this visit.  [3] Patient Active Problem List Diagnosis  . Primary hypertension  . Chronic pain of left knee  . Prediabetes  [4] No past medical history on file. [5] No past surgical history on file. [6] No family history on file.

## 2024-05-17 ENCOUNTER — Encounter (HOSPITAL_COMMUNITY): Payer: Self-pay | Admitting: *Deleted

## 2024-05-17 ENCOUNTER — Emergency Department (HOSPITAL_COMMUNITY)

## 2024-05-17 ENCOUNTER — Other Ambulatory Visit: Payer: Self-pay

## 2024-05-17 ENCOUNTER — Emergency Department (HOSPITAL_COMMUNITY)
Admission: EM | Admit: 2024-05-17 | Discharge: 2024-05-17 | Disposition: A | Discharge status: Home / Self Care | Attending: Emergency Medicine | Admitting: Emergency Medicine

## 2024-05-17 DIAGNOSIS — G8929 Other chronic pain: Secondary | ICD-10-CM | POA: Diagnosis not present

## 2024-05-17 DIAGNOSIS — Z79899 Other long term (current) drug therapy: Secondary | ICD-10-CM | POA: Diagnosis not present

## 2024-05-17 DIAGNOSIS — I10 Essential (primary) hypertension: Secondary | ICD-10-CM | POA: Diagnosis not present

## 2024-05-17 DIAGNOSIS — M545 Low back pain, unspecified: Secondary | ICD-10-CM | POA: Diagnosis present

## 2024-05-17 DIAGNOSIS — Z859 Personal history of malignant neoplasm, unspecified: Secondary | ICD-10-CM | POA: Insufficient documentation

## 2024-05-17 DIAGNOSIS — M5441 Lumbago with sciatica, right side: Secondary | ICD-10-CM | POA: Diagnosis not present

## 2024-05-17 MED ORDER — LIDOCAINE 5 % EX PTCH
1.0000 | MEDICATED_PATCH | CUTANEOUS | Status: DC
  Administered 2024-05-17: 1 via TRANSDERMAL
  Filled 2024-05-17: qty 1

## 2024-05-17 MED ORDER — OXYCODONE HCL 5 MG PO TABS
5.0000 mg | ORAL_TABLET | Freq: Once | ORAL | Status: AC
  Administered 2024-05-17: 5 mg via ORAL
  Filled 2024-05-17: qty 1

## 2024-05-17 MED ORDER — ACETAMINOPHEN 325 MG PO TABS
650.0000 mg | ORAL_TABLET | Freq: Once | ORAL | Status: AC
  Administered 2024-05-17: 650 mg via ORAL
  Filled 2024-05-17: qty 2

## 2024-05-17 MED ORDER — LIDOCAINE 5 % EX OINT: 1.0000 | TOPICAL_OINTMENT | Freq: Four times a day (QID) | CUTANEOUS | 0 refills | Status: DC | PRN

## 2024-05-17 MED ORDER — LIDOCAINE 5 % EX OINT: 1.0000 | TOPICAL_OINTMENT | Freq: Four times a day (QID) | CUTANEOUS | 0 refills | Status: AC | PRN

## 2024-05-17 NOTE — Discharge Instructions (Addendum)
 As discussed, please follow-up with your primary care provider next week.  Please also stop taking naproxen  and instead start taking Advil  and Tylenol  as listed below.  Seek emergency care if experiencing any new or worsening symptoms.  Alternating between 650 mg Tylenol  and 400 mg Advil : The best way to alternate taking Acetaminophen  (example Tylenol ) and Ibuprofen  (example Advil /Motrin ) is to take them 3 hours apart. For example, if you take ibuprofen  at 6 am you can then take Tylenol  at 9 am. You can continue this regimen throughout the day, making sure you do not exceed the recommended maximum dose for each drug.

## 2024-05-17 NOTE — ED Triage Notes (Signed)
 Patient c/o right flank pain onset 1 month ago , states she was seen at [facility] , had xrays however no dx. Was given. States someone recommended a back brace of which she got and states it does help when bending over. States she is scheduled for MRI howvever  pain is worse and she can't wait.

## 2024-05-17 NOTE — ED Provider Notes (Signed)
 [place] [facility] Provider Note   CSN: [id number] Arrival date & time: 05/17/24  9064     Patient presents with: Back Pain   Becky Gross is a 68 y.o. female with PMHx HTN, HLD OA who presents to ED concerned for atraumatic low back pain x1 month with sciatica down right leg. Patient met with PCP last week who obtained lumbar xray but patient has still not received results from this. Patient stating that pain is exacerbated by leaning forward. Patient has been taking naproxen  which does provide relief for a couple of hours. Patient also tried gabapentin which did not provide relief.   Patient denies urinary retention, fecal incontinence, saddle anesthesia, lower extremity weakness, hx of cancer, fever, significant trauma. Denies nausea, vomiting, diarrhea, dysuria, hematuria, hematochezia.    Back Pain      Prior to Admission medications   Medication Sig Start Date End Date Taking? Authorizing Provider  acetaminophen  (TYLENOL ) 500 MG tablet Take 2 tablets (1,000 mg total) by mouth every 8 (eight) hours as needed. 01/19/22   Atway, Rayann N, DO  amLODipine  (NORVASC ) 10 MG tablet Take 1 tablet (10 mg total) by mouth daily. 08/22/22   Lovie Clarity, MD  diclofenac  Sodium (VOLTAREN ) 1 % GEL Apply 4 g topically 4 (four) times daily. 06/22/22   Fernand Prost, MD  lidocaine  (XYLOCAINE ) 5 % ointment Apply 1 Application topically 4 (four) times daily as needed for up to 5 days. 05/17/24 05/22/24  Hoy Nidia FALCON, PA-C  methocarbamol  (ROBAXIN ) 500 MG tablet Take 1 tablet (500 mg total) by mouth every 8 (eight) hours as needed for muscle spasms. This is a short-term medicine. Please call the [facility] clinic if you have more pain to discuss other options. 08/22/22   Lovie Clarity, MD  naproxen  (NAPROSYN ) 500 MG tablet Take 1 tablet (500 mg total) by mouth daily as needed. 06/22/22   Fernand Prost, MD  polyethylene glycol (MIRALAX ) 17 g packet Take 17 g by mouth daily.  05/27/20   Seawell, Jaimie A, DO    Allergies: Patient has no known allergies.    Review of Systems  Musculoskeletal:  Positive for back pain.    Updated Vital Signs BP (!) 159/68   Pulse 62   Temp 98.3 F (36.8 C)   Resp 18   Ht 5' 1 (1.549 m)   Wt 63.5 kg   SpO2 100%   BMI 26.45 kg/m   Physical Exam Vitals and nursing note reviewed.  Constitutional:      General: She is not in acute distress.    Appearance: She is not ill-appearing or toxic-appearing.  HENT:     Head: Normocephalic and atraumatic.     Mouth/Throat:     Mouth: Mucous membranes are moist.  Eyes:     General: No scleral icterus.       Right eye: No discharge.        Left eye: No discharge.     Conjunctiva/sclera: Conjunctivae normal.  Cardiovascular:     Rate and Rhythm: Normal rate and regular rhythm.     Pulses: Normal pulses.     Heart sounds: Normal heart sounds. No murmur heard. Pulmonary:     Effort: Pulmonary effort is normal. No respiratory distress.     Breath sounds: Normal breath sounds. No wheezing, rhonchi or rales.  Abdominal:     General: Abdomen is flat. Bowel sounds are normal. There is no distension.     Palpations: Abdomen is soft.  There is no mass.     Tenderness: There is no abdominal tenderness.  Musculoskeletal:     Right lower leg: No edema.     Left lower leg: No edema.     Comments: Tenderness to palpation of right lower lumbar paraspinal muscles. +2 pedal pulse. 5/5 strength in BL LE. No midline tenderness. No overlying spinal erythema or warmth.  Sensation to light touch intact - no saddle anesthesia.  Skin:    General: Skin is warm and dry.     Findings: No rash.  Neurological:     General: No focal deficit present.     Mental Status: She is alert. Mental status is at baseline.  Psychiatric:        Mood and Affect: Mood normal.     (all labs ordered are listed, but only abnormal results are displayed) Labs Reviewed - No data to  display  EKG: None  Radiology: CT Lumbar Spine Wo Contrast Result Date: 05/17/2024 EXAM: CT OF THE LUMBAR SPINE WITHOUT CONTRAST 05/17/2024 12:17:12 PM TECHNIQUE: CT of the lumbar spine was performed without the administration of intravenous contrast. Multiplanar reformatted images are provided for review. Automated exposure control, iterative reconstruction, and/or weight based adjustment of the mA/kV was utilized to reduce the radiation dose to as low as reasonably achievable. COMPARISON: None available. CLINICAL HISTORY: Low back pain, increased fracture risk. Right flank pain for 1 month. FINDINGS: BONES AND ALIGNMENT: Considering the lowest ribs to be at T12, there are 4 non-rib-bearing lumbar vertebrae followed by a transitional segment which will be considered a largely sacralized L5. There is trace retrolisthesis of L2 on L3 and 5 mm anterolisthesis of L4 on L5. No acute fracture or suspicious lesion is identified in the lumbar spine. An old lateral left 7th rib fracture is noted. DISC LEVELS: T12-L1: Negative. L1-L2: Mild disc bulging without evidence of significant stenosis. L2-L3: Trace retrolisthesis, disc bulging, and mild facet hypertrophy likely result in mild bilateral lateral recess stenosis without evidence of significant spinal stenosis or neural foraminal stenosis. L3-L4: Disc bulging and mild to moderate facet hypertrophy result in mild left neural foraminal stenosis and borderline to mild spinal stenosis. L4-L5: Mild disc space narrowing. Anterolisthesis with bulging of uncovered disc and severe facet arthrosis result in mild spinal stenosis and mild bilateral neural foraminal stenosis. L5-S1: Transitional anatomy with rudimentary disc. No stenosis. SOFT TISSUES: No acute abnormality. Mild scarring or atelectasis in the lung bases. Mild abdominal aortic atherosclerosis without aneurysm. IMPRESSION: 1. Transitional lumbosacral anatomy. 2. No acute lumbar spine fracture. 3. Severe facet  arthrosis at L4-L5 with grade 1 anterolisthesis, mild spinal stenosis, and mild bilateral neural foraminal stenosis. 4. Mild disc and facet degeneration elsewhere. Electronically signed by: Dasie Hamburg MD 05/17/2024 12:39 PM EST RP Workstation: [id number]     Procedures   Medications Ordered in the ED  lidocaine (LIDODERM) 5 % 1 patch (1 patch Transdermal Patch Applied 05/17/24 1117)  oxyCODONE (Oxy IR/ROXICODONE) immediate release tablet 5 mg (5 mg Oral Given 05/17/24 1117)  acetaminophen  (TYLENOL ) tablet 650 mg (650 mg Oral Given 05/17/24 1117)                                    Medical Decision Making Amount and/or Complexity of Data Reviewed Radiology: ordered.  Risk OTC drugs. Prescription drug management.   This patient presents to the ED for concern of back pain, this involves an extensive number  of treatment options, and is a complaint that carries with it a high risk of complications and morbidity.  The differential diagnosis includes pyelonephritis, nephrolithiasis, spinal abscess, osteomyelitis, herniated disc, muscle strain, spinal fracture, meningitis, cancer, cauda equina syndrome.   Co morbidities that complicate the patient evaluation  HTN, HLD OA   Additional history obtained:  Additional history obtained from 11/20 PCP note: patient discharged with gabapentin and order for lumbar xray   Problem List / ED Course / Critical interventions / Medication management  Patient presents to ED concern for 1 month of right lower back pain with sciatica of right leg.  Physical exam with right lower lumbar paraspinal tenderness to palpation.  Rest of physical exam reassuring.  Patient afebrile with stable vitals. I ordered imaging studies including CT lumbar spine. I independently visualized and interpreted imaging which showed no acute process. I agree with the radiologist interpretation Shared all results with patient.  Answered all questions.  Recommended following up  with PCP for her sciatica.  Provided her with rehab exercises.  Educated on stopping naproxen  and starting to alternate Advil  and Tylenol  for pain.  Patient agreeable with plan. Staffed with Dr. Ellouise who agrees with plan. I have reviewed the patients home medicines and have made adjustments as needed The patient has been appropriately medically screened and/or stabilized in the ED. I have low suspicion for any other emergent medical condition which would require further screening, evaluation or treatment in the ED or require inpatient management. At time of discharge the patient is hemodynamically stable and in no acute distress. I have discussed work-up results and diagnosis with patient and answered all questions. Patient is agreeable with discharge plan. We discussed strict return precautions for returning to the emergency department and they verbalized understanding.     Social Determinants of Health:  none      Final diagnoses:  Chronic right-sided low back pain with right-sided sciatica    ED Discharge Orders          Ordered    lidocaine  (XYLOCAINE ) 5 % ointment  4 times daily PRN,   Status:  Discontinued        05/17/24 1254    lidocaine  (XYLOCAINE ) 5 % ointment  4 times daily PRN        05/17/24 1302               Hoy Nidia FALCON, PA-C 05/17/24 1311    Kingsley, Victoria K, DO 05/17/24 1358

## 2024-06-16 ENCOUNTER — Emergency Department (HOSPITAL_COMMUNITY)
Admission: EM | Admit: 2024-06-16 | Discharge: 2024-06-16 | Disposition: A | Discharge status: Home / Self Care | Attending: Emergency Medicine | Admitting: Emergency Medicine

## 2024-06-16 ENCOUNTER — Emergency Department (HOSPITAL_COMMUNITY)

## 2024-06-16 ENCOUNTER — Encounter (HOSPITAL_COMMUNITY): Payer: Self-pay

## 2024-06-16 ENCOUNTER — Other Ambulatory Visit: Payer: Self-pay

## 2024-06-16 DIAGNOSIS — Z79899 Other long term (current) drug therapy: Secondary | ICD-10-CM | POA: Insufficient documentation

## 2024-06-16 DIAGNOSIS — I10 Essential (primary) hypertension: Secondary | ICD-10-CM | POA: Insufficient documentation

## 2024-06-16 DIAGNOSIS — M1711 Unilateral primary osteoarthritis, right knee: Secondary | ICD-10-CM | POA: Diagnosis not present

## 2024-06-16 DIAGNOSIS — M25561 Pain in right knee: Secondary | ICD-10-CM | POA: Diagnosis present

## 2024-06-16 MED ORDER — MELOXICAM 15 MG PO TABS: 15.0000 mg | ORAL_TABLET | Freq: Every day | ORAL | 0 refills | Status: AC

## 2024-06-16 MED ORDER — METHYLPREDNISOLONE 4 MG PO TBPK: ORAL_TABLET | Freq: Every day | ORAL | 0 refills | Status: AC

## 2024-06-16 MED ORDER — METHYLPREDNISOLONE 4 MG PO TBPK: ORAL_TABLET | Freq: Every day | ORAL | 0 refills | Status: DC

## 2024-06-16 MED ORDER — MELOXICAM 15 MG PO TABS: 15.0000 mg | ORAL_TABLET | Freq: Every day | ORAL | 0 refills | Status: DC

## 2024-06-16 NOTE — ED Triage Notes (Signed)
 Patient states that she bumped her right knee into a chair 3 days ago. She states that she in unable bear all of her weight on the knee now. She states it is a little swollen.

## 2024-06-16 NOTE — ED Provider Triage Note (Signed)
 Emergency Medicine Provider Triage Evaluation Note  Malicia Blasdel , a 68 y.o. female  was evaluated in triage.  Pt complains of knee injury. Accidentally struck R knee against table 3 days ago.  Felt a pop.  Has had pain to R knee, improve with heating pad.  No hip or ankle pain  Review of Systems  Positive: As above Negative: As above  Physical Exam  BP (!) 151/68 (BP Location: Left Leg)   Pulse 64   Temp 98.2 F (36.8 C) (Oral)   Resp (!) 24   SpO2 96%  Gen:   Awake, no distress   Resp:  Normal effort  MSK:   Moves extremities without difficulty  Other:    Medical Decision Making  Medically screening exam initiated at 1:07 PM.  Appropriate orders placed.  Jennife Zaucha was informed that the remainder of the evaluation will be completed by another provider, this initial triage assessment does not replace that evaluation, and the importance of remaining in the ED until their evaluation is complete.     Nivia Colon, PA-C 06/16/24 1309

## 2024-06-16 NOTE — ED Provider Notes (Signed)
 " [place] EMERGENCY DEPARTMENT AT [facility] Provider Note   CSN: [id number] Arrival date & time: 06/16/24  1109     Patient presents with: Knee Pain   Becky Gross is a 68 y.o. female.   Pt is a 68 yo female with pmhx significant for HTN.  Pt said she hit her right knee on a table 3 days ago.  She still works and is having trouble doing her job.  She is here to make sure it's not broken.       Prior to Admission medications  Medication Sig Start Date End Date Taking? Authorizing Provider  acetaminophen  (TYLENOL ) 500 MG tablet Take 2 tablets (1,000 mg total) by mouth every 8 (eight) hours as needed. 01/19/22   Atway, Rayann N, DO  amLODipine  (NORVASC ) 10 MG tablet Take 1 tablet (10 mg total) by mouth daily. 08/22/22   Lovie Clarity, MD  diclofenac  Sodium (VOLTAREN ) 1 % GEL Apply 4 g topically 4 (four) times daily. 06/22/22   Fernand Prost, MD  meloxicam  (MOBIC ) 15 MG tablet Take 1 tablet (15 mg total) by mouth daily. 06/16/24   Dean Clarity, MD  methocarbamol  (ROBAXIN ) 500 MG tablet Take 1 tablet (500 mg total) by mouth every 8 (eight) hours as needed for muscle spasms. This is a short-term medicine. Please call the [facility] clinic if you have more pain to discuss other options. 08/22/22   Lovie Clarity, MD  methylPREDNISolone  (MEDROL  DOSEPAK) 4 MG TBPK tablet Take by mouth daily. Day 1:  2 pills at breakfast, 1 pill at lunch, 1 pill after supper, 2 pills at bedtime;Day 2:  1 pill at breakfast, 1 pill at lunch, 1 pill after supper, 2 pills at bedtime;Day 3:  1 pill at breakfast, 1 pill at lunch, 1 pill after supper, 1 pill at bedtime;Day 4:  1 pill at breakfast, 1 pill at lunch, 1 pill at bedtime;Day 5:  1 pill at breakfast, 1 pill at bedtime;Day 6:  1 pill at breakfast 06/16/24   Latrail Pounders, MD  naproxen  (NAPROSYN ) 500 MG tablet Take 1 tablet (500 mg total) by mouth daily as needed. 06/22/22   Fernand Prost, MD  polyethylene glycol (MIRALAX ) 17 g packet Take 17 g by mouth daily.  05/27/20   Seawell, Jaimie A, DO    Allergies: Patient has no known allergies.    Review of Systems  Musculoskeletal:        Right knee pain  All other systems reviewed and are negative.   Updated Vital Signs BP 136/64   Pulse 72   Temp 98 F (36.7 C)   Resp 14   SpO2 99%   Physical Exam Vitals and nursing note reviewed.  Constitutional:      Appearance: Normal appearance.  HENT:     Head: Normocephalic and atraumatic.     Right Ear: External ear normal.     Left Ear: External ear normal.     Nose: Nose normal.     Mouth/Throat:     Mouth: Mucous membranes are moist.     Pharynx: Oropharynx is clear.  Eyes:     Extraocular Movements: Extraocular movements intact.     Conjunctiva/sclera: Conjunctivae normal.     Pupils: Pupils are equal, round, and reactive to light.  Cardiovascular:     Rate and Rhythm: Normal rate and regular rhythm.     Pulses: Normal pulses.     Heart sounds: Normal heart sounds.  Pulmonary:     Effort: Pulmonary effort  is normal.     Breath sounds: Normal breath sounds.  Abdominal:     General: Abdomen is flat. Bowel sounds are normal.     Palpations: Abdomen is soft.  Musculoskeletal:     Cervical back: Normal range of motion and neck supple.     Comments: Mild swelling right knee.  No significant effusion.  She has good rom.  Skin:    General: Skin is warm.     Capillary Refill: Capillary refill takes less than 2 seconds.  Neurological:     General: No focal deficit present.     Mental Status: She is alert and oriented to person, place, and time.  Psychiatric:        Mood and Affect: Mood normal.        Behavior: Behavior normal.        Thought Content: Thought content normal.        Judgment: Judgment normal.     (all labs ordered are listed, but only abnormal results are displayed) Labs Reviewed - No data to display  EKG: None  Radiology: DG Knee Complete 4 Views Right Result Date: 06/16/2024 EXAM: 4 OR MORE VIEW(S) XRAY  OF THE KNEE 06/16/2024 01:52:00 PM COMPARISON: None available. CLINICAL HISTORY: injury FINDINGS: BONES AND JOINTS: No acute fracture. No malalignment. No significant joint effusion. Benign-appearing sclerosis distal femoral shaft. Minor marginal spurring from all 3 compartments. SOFT TISSUES: Anterior small soft tissue calcification overlying the midshaft of the femur. . IMPRESSION: 1. No evidence of acute traumatic injury. 2. Mild tricompartmental osteoarthritis. Electronically signed by: Waddell Calk MD 06/16/2024 02:53 PM EST RP Workstation: [id number]     Procedures   Medications Ordered in the ED - No data to display                                  Medical Decision Making Risk Prescription drug management.   This patient presents to the ED for concern of knee pain, this involves an extensive number of treatment options, and is a complaint that carries with it a high risk of complications and morbidity.  The differential diagnosis includes fx, sprain, contusion   Co morbidities that complicate the patient evaluation  htn   Additional history obtained:  Additional history obtained from epic chart review External records from outside source obtained and reviewed including husband   Imaging Studies ordered:  I ordered imaging studies including r knee  I independently visualized and interpreted imaging which showed  . No evidence of acute traumatic injury.  2. Mild tricompartmental osteoarthritis.   I agree with the radiologist interpretation   Medicines ordered and prescription drug management:  I have reviewed the patients home medicines and have made adjustments as needed   Test Considered:  xr   Problem List / ED Course:  R knee pain:  possibly exac of arthritis from the trauma.  She has no significant effusion to drain.  No fx.  She is stable for d/c.  Return if worse.  F/u with ortho.  Social Determinants of Health:  Lives at  home   Dispostion:  After consideration of the diagnostic results and the patients response to treatment, I feel that the patent would benefit from discharge with outpatient f/u.       Final diagnoses:  Osteoarthritis of right knee, unspecified osteoarthritis type    ED Discharge Orders          Ordered  methylPREDNISolone  (MEDROL  DOSEPAK) 4 MG TBPK tablet  Daily,   Status:  Discontinued        06/16/24 1621    meloxicam  (MOBIC ) 15 MG tablet  Daily,   Status:  Discontinued        06/16/24 1621    methylPREDNISolone  (MEDROL  DOSEPAK) 4 MG TBPK tablet  Daily        06/16/24 1640    meloxicam  (MOBIC ) 15 MG tablet  Daily        06/16/24 1640               Dean Clarity, MD 06/16/24 1731  "
